# Patient Record
Sex: Female | Born: 1990 | Race: Black or African American | Hispanic: No | Marital: Single | Smoking: Former smoker
Health system: Southern US, Community
[De-identification: ages and names within clinical notes are randomized; demographics above are authoritative.]

## PROBLEM LIST (undated history)

## (undated) ENCOUNTER — Inpatient Hospital Stay (HOSPITAL_COMMUNITY): Payer: Self-pay

## (undated) HISTORY — PX: NO PAST SURGERIES: SHX2092

## (undated) HISTORY — PX: WISDOM TOOTH EXTRACTION: SHX21

---
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
## 2012-04-03 ENCOUNTER — Emergency Department (INDEPENDENT_AMBULATORY_CARE_PROVIDER_SITE_OTHER)
Admission: EM | Admit: 2012-04-03 | Discharge: 2012-04-03 | Disposition: A | Discharge status: Home / Self Care | Payer: Medicaid Other | Source: Home / Self Care | Attending: Emergency Medicine | Admitting: Emergency Medicine

## 2012-04-03 ENCOUNTER — Encounter (HOSPITAL_COMMUNITY): Payer: Self-pay

## 2012-04-03 DIAGNOSIS — N939 Abnormal uterine and vaginal bleeding, unspecified: Secondary | ICD-10-CM

## 2012-04-03 DIAGNOSIS — N926 Irregular menstruation, unspecified: Secondary | ICD-10-CM

## 2012-04-03 LAB — POCT URINALYSIS DIP (DEVICE)
Bilirubin Urine: NEGATIVE
Glucose, UA: NEGATIVE mg/dL
Specific Gravity, Urine: 1.025 (ref 1.005–1.030)
Urobilinogen, UA: 0.2 mg/dL (ref 0.0–1.0)

## 2012-04-03 LAB — HCG, SERUM, QUALITATIVE: Preg, Serum: NEGATIVE

## 2012-04-03 MED ORDER — PRENATAL VITAMINS (DIS) PO TABS: 1.0000 | ORAL_TABLET | Freq: Every day | ORAL | Status: DC

## 2012-04-03 NOTE — ED Provider Notes (Signed)
History     CSN: [id number]  Arrival date & time 04/03/12  1358   None     Chief Complaint  Patient presents with  . Possible Pregnancy    (Consider location/radiation/quality/duration/timing/severity/associated sxs/prior treatment) HPI Comments: Wants pregnancy test, period is late and home pregnancy test is positive.  Thinks might be pregnant as is hungry all the time and urinating frequently.  Denies any pain, denies any vag bleeding.   The history is provided by the patient.    Past Medical History  Diagnosis Date  . Asthma     Past Surgical History  Procedure Date  . No past surgeries     Family History  Problem Relation Age of Onset  . Anesthesia problems Neg Hx     History  Substance Use Topics  . Smoking status: Current Some Day Smoker    Types: Cigarettes  . Smokeless tobacco: Not on file  . Alcohol Use: No    OB History    Grav Para Term Preterm Abortions TAB SAB Ect Mult Living   1               Review of Systems  Constitutional: Negative for fever and chills.  Gastrointestinal: Positive for vomiting. Negative for abdominal pain.       Emesisx1 yesterday when eating chicken, feels fine now.   Genitourinary: Positive for frequency. Negative for dysuria, flank pain, vaginal bleeding, vaginal discharge and vaginal pain.    Allergies  Ciprofloxacin  Home Medications   Current Outpatient Rx  Name Route Sig Dispense Refill  . PRENATAL VITAMINS (DIS) PO TABS Oral Take 1 tablet by mouth daily. 30 tablet 3    BP 121/75  Pulse 90  Temp 98.7 F (37.1 C) (Oral)  Resp 18  SpO2 100%  LMP 02/26/2012  Breastfeeding? Unknown  Physical Exam  Constitutional: She appears well-developed and well-nourished.  Pulmonary/Chest: Effort normal.  Abdominal: Soft. There is no tenderness. There is no CVA tenderness.    ED Course  Procedures (including critical care time)   Labs Reviewed  POCT PREGNANCY, URINE  POCT URINALYSIS DIP (DEVICE)  HCG,  SERUM, QUALITATIVE   No results found.   1. Abnormal menstrual cycle       MDM  Urine preg test here is neg, pt's test at home is pos.  Serum qualitative hcg ordered.  Pt is supposed to call tomorrow for results.  Given rx for prenatal vitamins, and discussed plans for prenatal care if preg or birth control if not preg.         Cathlyn Parsons, NP 04/03/12 [phone]

## 2012-04-03 NOTE — ED Notes (Signed)
Sexually active w/o BC; LMP 02-26-2012; reportedly had a positive home pregnancy test, wants confirmation

## 2012-04-04 ENCOUNTER — Telehealth (HOSPITAL_COMMUNITY): Payer: Self-pay | Admitting: *Deleted

## 2012-04-04 NOTE — ED Provider Notes (Signed)
Medical screening examination/treatment/procedure(s) were performed by non-physician practitioner and as supervising physician I was immediately available for consultation/collaboration.  Melony Tenpas, M.D.   Ambrea Hegler C Malee Grays, MD 04/04/12 0812 

## 2012-04-04 NOTE — ED Notes (Signed)
Pt. called for the result of the serum pregnancy test. Pt. verified x 2 and told it was negative. Vassie Moselle 04/04/2012

## 2012-06-25 ENCOUNTER — Encounter (HOSPITAL_COMMUNITY): Payer: Self-pay | Admitting: Emergency Medicine

## 2012-06-25 ENCOUNTER — Emergency Department (HOSPITAL_COMMUNITY)
Admission: EM | Admit: 2012-06-25 | Discharge: 2012-06-26 | Disposition: A | Discharge status: Home / Self Care | Payer: Self-pay | Attending: Emergency Medicine | Admitting: Emergency Medicine

## 2012-06-25 DIAGNOSIS — F17200 Nicotine dependence, unspecified, uncomplicated: Secondary | ICD-10-CM | POA: Insufficient documentation

## 2012-06-25 DIAGNOSIS — N760 Acute vaginitis: Secondary | ICD-10-CM | POA: Insufficient documentation

## 2012-06-25 DIAGNOSIS — Z3202 Encounter for pregnancy test, result negative: Secondary | ICD-10-CM | POA: Insufficient documentation

## 2012-06-25 DIAGNOSIS — B9689 Other specified bacterial agents as the cause of diseases classified elsewhere: Secondary | ICD-10-CM

## 2012-06-25 DIAGNOSIS — R3915 Urgency of urination: Secondary | ICD-10-CM | POA: Insufficient documentation

## 2012-06-25 DIAGNOSIS — R34 Anuria and oliguria: Secondary | ICD-10-CM | POA: Insufficient documentation

## 2012-06-25 DIAGNOSIS — N390 Urinary tract infection, site not specified: Secondary | ICD-10-CM | POA: Insufficient documentation

## 2012-06-25 DIAGNOSIS — R300 Dysuria: Secondary | ICD-10-CM | POA: Insufficient documentation

## 2012-06-25 DIAGNOSIS — J45909 Unspecified asthma, uncomplicated: Secondary | ICD-10-CM | POA: Insufficient documentation

## 2012-06-25 DIAGNOSIS — R350 Frequency of micturition: Secondary | ICD-10-CM | POA: Insufficient documentation

## 2012-06-25 LAB — URINALYSIS, ROUTINE W REFLEX MICROSCOPIC
Glucose, UA: NEGATIVE mg/dL
Protein, ur: NEGATIVE mg/dL
Urobilinogen, UA: 0.2 mg/dL (ref 0.0–1.0)

## 2012-06-25 LAB — POCT PREGNANCY, URINE: Preg Test, Ur: NEGATIVE

## 2012-06-25 LAB — URINE MICROSCOPIC-ADD ON

## 2012-06-25 NOTE — ED Provider Notes (Signed)
History     CSN: [id number]  Arrival date & time 06/25/12  1816   First MD Initiated Contact with Patient 06/25/12 2257      Chief Complaint  Patient presents with  . Urinary Tract Infection  . Vaginal Discharge    (Consider location/radiation/quality/duration/timing/severity/associated sxs/prior treatment) HPI  22 year old generally healthy female presents complaining of dysuria and vaginal discharge. Patient reports for the past 4 days she has had urinary urgency, and frequency with only small amount of urine each time she urinate. Denies any burning on urination. Since yesterday she noticed some vaginal discharge. She also reports some vaginal irritation but without pain. Onset was gradual, persistent, mild in severity, nonradiating. She is sexually active with only one partner using protection. She denies fever, chills, chest pain, shortness of breath, abdominal pain, back pain, hematuria, vaginal bleeding, or rash. Her last menstrual period was December 28. She reports no prior history of STD.  Past Medical History  Diagnosis Date  . Asthma     Past Surgical History  Procedure Date  . No past surgeries     Family History  Problem Relation Age of Onset  . Anesthesia problems Neg Hx     History  Substance Use Topics  . Smoking status: Current Some Day Smoker    Types: Cigarettes  . Smokeless tobacco: Not on file  . Alcohol Use: No    OB History    Grav Para Term Preterm Abortions TAB SAB Ect Mult Living   1               Review of Systems  Constitutional: Negative for fever.  Gastrointestinal: Negative for nausea and vomiting.  Genitourinary: Positive for dysuria, urgency, frequency, decreased urine volume and vaginal discharge. Negative for hematuria, flank pain, vaginal bleeding, genital sores and vaginal pain.  Skin: Negative for rash.  Neurological: Negative for numbness.    Allergies  Ciprofloxacin  Home Medications  No current outpatient  prescriptions on file.  BP 115/69  Pulse 74  Temp 97.8 F (36.6 C) (Oral)  Resp 18  SpO2 97%  Breastfeeding? Unknown  Physical Exam  Nursing note and vitals reviewed. Constitutional: She appears well-developed and well-nourished. No distress.  HENT:  Head: Normocephalic and atraumatic.  Eyes: Conjunctivae normal are normal.  Neck: Normal range of motion. Neck supple.  Cardiovascular: Normal rate and regular rhythm.   Pulmonary/Chest: Effort normal and breath sounds normal. She exhibits no tenderness.  Abdominal: Soft. There is no tenderness.  Genitourinary: Uterus normal. There is no rash or lesion on the right labia. There is no rash or lesion on the left labia. Cervix exhibits no motion tenderness and no discharge. Right adnexum displays no mass and no tenderness. Left adnexum displays no mass and no tenderness. No erythema, tenderness or bleeding around the vagina. Vaginal discharge (moderate white vaginal discharge) found.       Chaperone present  Lymphadenopathy:       Right: No inguinal adenopathy present.       Left: No inguinal adenopathy present.    ED Course  Procedures (including critical care time)  Labs Reviewed  URINALYSIS, ROUTINE W REFLEX MICROSCOPIC - Abnormal; Notable for the following:    APPearance HAZY (*)     Hgb urine dipstick SMALL (*)     Leukocytes, UA TRACE (*)     All other components within normal limits  URINE MICROSCOPIC-ADD ON - Abnormal; Notable for the following:    Squamous Epithelial / LPF MANY (*)  Bacteria, UA FEW (*)     All other components within normal limits  POCT PREGNANCY, URINE  WET PREP, GENITAL  GC/CHLAMYDIA PROBE AMP   No results found.   No diagnosis found.  Results for orders placed during the hospital encounter of 06/25/12  URINALYSIS, ROUTINE W REFLEX MICROSCOPIC      Component Value Range   Color, Urine YELLOW  YELLOW   APPearance HAZY (*) CLEAR   Specific Gravity, Urine 1.025  1.005 - 1.030   pH 6.5  5.0  - 8.0   Glucose, UA NEGATIVE  NEGATIVE mg/dL   Hgb urine dipstick SMALL (*) NEGATIVE   Bilirubin Urine NEGATIVE  NEGATIVE   Ketones, ur NEGATIVE  NEGATIVE mg/dL   Protein, ur NEGATIVE  NEGATIVE mg/dL   Urobilinogen, UA 0.2  0.0 - 1.0 mg/dL   Nitrite NEGATIVE  NEGATIVE   Leukocytes, UA TRACE (*) NEGATIVE  POCT PREGNANCY, URINE      Component Value Range   Preg Test, Ur NEGATIVE  NEGATIVE  URINE MICROSCOPIC-ADD ON      Component Value Range   Squamous Epithelial / LPF MANY (*) RARE   WBC, UA 0-2  <3 WBC/hpf   RBC / HPF 3-6  <3 RBC/hpf   Bacteria, UA FEW (*) RARE   Urine-Other MUCOUS PRESENT    WET PREP, GENITAL      Component Value Range   Yeast Wet Prep HPF POC FEW (*) NONE SEEN   Trich, Wet Prep NONE SEEN  NONE SEEN   Clue Cells Wet Prep HPF POC MODERATE (*) NONE SEEN   WBC, Wet Prep HPF POC FEW (*) NONE SEEN   No results found.  1. BV  MDM  Pt reports having urinary urgency and frequency along with having white vaginal discharge.  Pelvic exam is remarkable for moderate white vaginal discharge however without adnexal tenderness or CMT.  Abdomen nonsurgical.     1:32 AM Labs shows evidence of BV.  Will treat with flagyl.  Care instruction provided.    BP 115/69  Pulse 74  Temp 97.8 F (36.6 C) (Oral)  Resp 18  SpO2 97%  Breastfeeding? Unknown  I have reviewed nursing notes and vital signs.  I reviewed available ER/hospitalization records thought the EMR      Fayrene Helper, [place] 06/26/12 0133

## 2012-06-25 NOTE — ED Notes (Signed)
Pt c/o white vaginal discharge and urinary frequency x several days; pt sts LMP was 12/28

## 2012-06-26 LAB — WET PREP, GENITAL: Trich, Wet Prep: NONE SEEN

## 2012-06-26 LAB — GC/CHLAMYDIA PROBE AMP
CT Probe RNA: NEGATIVE
GC Probe RNA: NEGATIVE

## 2012-06-26 NOTE — ED Provider Notes (Signed)
Medical screening examination/treatment/procedure(s) were performed by non-physician practitioner and as supervising physician I was immediately available for consultation/collaboration.   Gerhard Munch, MD 06/26/12 [phone]

## 2014-03-08 ENCOUNTER — Emergency Department (HOSPITAL_BASED_OUTPATIENT_CLINIC_OR_DEPARTMENT_OTHER)
Admission: EM | Admit: 2014-03-08 | Discharge: 2014-03-08 | Disposition: A | Discharge status: Home / Self Care | Payer: BC Managed Care – PPO | Attending: Emergency Medicine | Admitting: Emergency Medicine

## 2014-03-08 ENCOUNTER — Encounter (HOSPITAL_BASED_OUTPATIENT_CLINIC_OR_DEPARTMENT_OTHER): Payer: Self-pay | Admitting: Emergency Medicine

## 2014-03-08 DIAGNOSIS — F17200 Nicotine dependence, unspecified, uncomplicated: Secondary | ICD-10-CM | POA: Diagnosis not present

## 2014-03-08 DIAGNOSIS — J45901 Unspecified asthma with (acute) exacerbation: Secondary | ICD-10-CM | POA: Diagnosis not present

## 2014-03-08 DIAGNOSIS — R0602 Shortness of breath: Secondary | ICD-10-CM | POA: Diagnosis present

## 2014-03-08 MED ORDER — IPRATROPIUM-ALBUTEROL 0.5-2.5 (3) MG/3ML IN SOLN
3.0000 mL | Freq: Once | RESPIRATORY_TRACT | Status: AC
  Administered 2014-03-08: 3 mL via RESPIRATORY_TRACT
  Filled 2014-03-08: qty 3

## 2014-03-08 MED ORDER — ALBUTEROL SULFATE HFA 108 (90 BASE) MCG/ACT IN AERS
1.0000 | INHALATION_SPRAY | RESPIRATORY_TRACT | Status: DC | PRN
  Administered 2014-03-08: 2 via RESPIRATORY_TRACT
  Filled 2014-03-08: qty 6.7

## 2014-03-08 NOTE — ED Notes (Signed)
Pt brought in by EMS from work c/o anxiety, with hx of same

## 2014-03-08 NOTE — Discharge Instructions (Signed)

## 2014-03-08 NOTE — ED Provider Notes (Signed)
CSN: [id number]     Arrival date & time 03/08/14  1018 History   First MD Initiated Contact with Patient 03/08/14 1126     Chief Complaint  Patient presents with  . Shortness of Breath    Patient is a 23 y.o. female presenting with shortness of breath. The history is provided by the patient.  Shortness of Breath Severity:  Moderate Onset quality:  Gradual Duration:  1 day Timing:  Constant Progression:  Resolved Chronicity:  Recurrent Relieved by: the breathing treatment in the ED. Associated symptoms: chest pain and wheezing   Associated symptoms: no abdominal pain, no fever and no sore throat   Risk factors: no hx of PE/DVT    the patient has a history of asthma as a child. She no longer has to take medications. Today she started feeling like she was wheezing and having shortness of breath. Coworkers thought she was having an anxiety attack. The patient did not feel that she was having issues with anxiety she said she was just feeling short of breath.  Past Medical History  Diagnosis Date  . Asthma    Past Surgical History  Procedure Laterality Date  . No past surgeries     Family History  Problem Relation Age of Onset  . Anesthesia problems Neg Hx    History  Substance Use Topics  . Smoking status: Current Some Day Smoker -- 0.50 packs/day    Types: Cigarettes  . Smokeless tobacco: Not on file  . Alcohol Use: No   OB History   Grav Para Term Preterm Abortions TAB SAB Ect Mult Living   1              Review of Systems  Constitutional: Negative for fever.  HENT: Negative for sore throat.   Respiratory: Positive for shortness of breath and wheezing.   Cardiovascular: Positive for chest pain.  Gastrointestinal: Negative for abdominal pain.      Allergies  Ciprofloxacin  Home Medications   Prior to Admission medications   Not on File   BP 114/70  Pulse 88  Temp(Src) 98.1 F (36.7 C) (Oral)  Resp 18  Ht  (1.651 m)  Wt 147 lb (66.679 kg)  BMI  24.46 kg/m2  SpO2 100%  LMP 01/28/2014 Physical Exam  Nursing note and vitals reviewed. Constitutional: She appears well-developed and well-nourished. No distress.  HENT:  Head: Normocephalic and atraumatic.  Right Ear: External ear normal.  Left Ear: External ear normal.  Eyes: Conjunctivae are normal. Right eye exhibits no discharge. Left eye exhibits no discharge. No scleral icterus.  Neck: Neck supple. No tracheal deviation present.  Cardiovascular: Normal rate, regular rhythm and intact distal pulses.   Pulmonary/Chest: Effort normal and breath sounds normal. No stridor. No respiratory distress. She has no wheezes. She has no rales.  Normal respiratory rate during my exam  Abdominal: Soft. Bowel sounds are normal. She exhibits no distension. There is no tenderness. There is no rebound and no guarding.  Musculoskeletal: She exhibits no edema and no tenderness.  Neurological: She is alert. She has normal strength. No cranial nerve deficit (no facial droop, extraocular movements intact, no slurred speech) or sensory deficit. She exhibits normal muscle tone. She displays no seizure activity. Coordination normal.  Skin: Skin is warm and dry. No rash noted.  Psychiatric: She has a normal mood and affect.    ED Course  Procedures (including critical care time) Medications  albuterol (PROVENTIL HFA;VENTOLIN HFA) 108 (90 BASE) MCG/ACT inhaler  1-2 puff (not administered)  ipratropium-albuterol (DUONEB) 0.5-2.5 (3) MG/3ML nebulizer solution 3 mL (3 mLs Nebulization Given 03/08/14 1025)     MDM   Final diagnoses:  Asthma exacerbation    The patient was given albuterol treatment while in the emergency department. She had complete relief of her symptoms. Wheezing was noted on her initial exam by the respiratory therapist. Patient is feeling better right now. She denies any chest pain. She is not feeling anxious. I suspect his symptoms were related to bronchospasm. I will discharge her home  with an albuterol inhaler. I discussed the importance of smoking cessation    Linwood Dibbles, MD 03/08/14 1141

## 2014-03-08 NOTE — ED Notes (Signed)
MD at bedside. 

## 2014-03-13 ENCOUNTER — Emergency Department (HOSPITAL_COMMUNITY): Payer: BC Managed Care – PPO

## 2014-03-13 ENCOUNTER — Emergency Department (HOSPITAL_COMMUNITY)
Admission: EM | Admit: 2014-03-13 | Discharge: 2014-03-14 | Disposition: A | Discharge status: Home / Self Care | Payer: BC Managed Care – PPO | Attending: Emergency Medicine | Admitting: Emergency Medicine

## 2014-03-13 ENCOUNTER — Encounter (HOSPITAL_COMMUNITY): Payer: Self-pay | Admitting: Emergency Medicine

## 2014-03-13 DIAGNOSIS — S99929A Unspecified injury of unspecified foot, initial encounter: Principal | ICD-10-CM

## 2014-03-13 DIAGNOSIS — Y9389 Activity, other specified: Secondary | ICD-10-CM | POA: Diagnosis not present

## 2014-03-13 DIAGNOSIS — M25561 Pain in right knee: Secondary | ICD-10-CM

## 2014-03-13 DIAGNOSIS — S99919A Unspecified injury of unspecified ankle, initial encounter: Secondary | ICD-10-CM | POA: Diagnosis present

## 2014-03-13 DIAGNOSIS — J45909 Unspecified asthma, uncomplicated: Secondary | ICD-10-CM | POA: Diagnosis not present

## 2014-03-13 DIAGNOSIS — S8990XA Unspecified injury of unspecified lower leg, initial encounter: Secondary | ICD-10-CM | POA: Insufficient documentation

## 2014-03-13 DIAGNOSIS — F17200 Nicotine dependence, unspecified, uncomplicated: Secondary | ICD-10-CM | POA: Insufficient documentation

## 2014-03-13 DIAGNOSIS — Y92410 Unspecified street and highway as the place of occurrence of the external cause: Secondary | ICD-10-CM | POA: Diagnosis not present
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
## 2014-03-14 MED ORDER — OXYCODONE-ACETAMINOPHEN 5-325 MG PO TABS: 1.0000 | ORAL_TABLET | Freq: Four times a day (QID) | ORAL | Status: DC | PRN

## 2014-03-14 MED ORDER — NAPROXEN 500 MG PO TABS: 500.0000 mg | ORAL_TABLET | Freq: Two times a day (BID) | ORAL | Status: DC

## 2014-03-14 MED ORDER — OXYCODONE-ACETAMINOPHEN 5-325 MG PO TABS
2.0000 | ORAL_TABLET | Freq: Once | ORAL | Status: AC
  Administered 2014-03-14: 2 via ORAL
  Filled 2014-03-14: qty 2
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
## 2014-03-14 NOTE — ED Provider Notes (Signed)
Medical screening examination/treatment/procedure(s) were performed by non-physician practitioner and as supervising physician I was immediately available for consultation/collaboration.   EKG Interpretation None       Nixxon Faria M Cipriano Millikan, MD 03/14/14 0243 

## 2014-03-14 NOTE — Discharge Instructions (Signed)
Your x-ray is negative for fracture or dislocation of your right knee. Recommend you keep your knee in a knee sleeve for stability and comfort. Take naproxen as prescribed for pain. You may take Percocet as needed for severe pain control. Apply ice to your knee 3-4 times per day. Use crutches as needed when walking. Follow up with orthopedics if symptoms persist despite recommended treatment after one to 2 weeks.  Arthralgia Your caregiver has diagnosed you as suffering from an arthralgia. Arthralgia means there is pain in a joint. This can come from many reasons including:  Bruising the joint which causes soreness (inflammation) in the joint.  Wear and tear on the joints which occur as we grow older (osteoarthritis).  Overusing the joint.  Various forms of arthritis.  Infections of the joint. Regardless of the cause of pain in your joint, most of these different pains respond to anti-inflammatory drugs and rest. The exception to this is when a joint is infected, and these cases are treated with antibiotics, if it is a bacterial infection. HOME CARE INSTRUCTIONS   Rest the injured area for as long as directed by your caregiver. Then slowly start using the joint as directed by your caregiver and as the pain allows. Crutches as directed may be useful if the ankles, knees or hips are involved. If the knee was splinted or casted, continue use and care as directed. If an stretchy or elastic wrapping bandage has been applied today, it should be removed and re-applied every 3 to 4 hours. It should not be applied tightly, but firmly enough to keep swelling down. Watch toes and feet for swelling, bluish discoloration, coldness, numbness or excessive pain. If any of these problems (symptoms) occur, remove the ace bandage and re-apply more loosely. If these symptoms persist, contact your caregiver or return to this location.  For the first 24 hours, keep the injured extremity elevated on pillows while lying  down.  Apply ice for 15-20 minutes to the sore joint every couple hours while awake for the first half day. Then 03-04 times per day for the first 48 hours. Put the ice in a plastic bag and place a towel between the bag of ice and your skin.  Wear any splinting, casting, elastic bandage applications, or slings as instructed.  Only take over-the-counter or prescription medicines for pain, discomfort, or fever as directed by your caregiver. Do not use aspirin immediately after the injury unless instructed by your physician. Aspirin can cause increased bleeding and bruising of the tissues.  If you were given crutches, continue to use them as instructed and do not resume weight bearing on the sore joint until instructed. Persistent pain and inability to use the sore joint as directed for more than 2 to 3 days are warning signs indicating that you should see a caregiver for a follow-up visit as soon as possible. Initially, a hairline fracture (break in bone) may not be evident on X-rays. Persistent pain and swelling indicate that further evaluation, non-weight bearing or use of the joint (use of crutches or slings as instructed), or further X-rays are indicated. X-rays may sometimes not show a small fracture until a week or 10 days later. Make a follow-up appointment with your own caregiver or one to whom we have referred you. A radiologist (specialist in reading X-rays) may read your X-rays. Make sure you know how you are to obtain your X-ray results. Do not assume everything is normal if you do not hear from [place]. SEEK  MEDICAL CARE IF: Bruising, swelling, or pain increases. SEEK IMMEDIATE MEDICAL CARE IF:   Your fingers or toes are numb or blue.  The pain is not responding to medications and continues to stay the same or get worse.  The pain in your joint becomes severe.  You develop a fever over 102 F (38.9 C).  It becomes impossible to move or use the joint. MAKE SURE YOU:   Understand these  instructions.  Will watch your condition.  Will get help right away if you are not doing well or get worse. Document Released: 06/05/2005 Document Revised: 08/28/2011 Document Reviewed: 01/22/2008 [facility] Patient Information 2015 [place], [place]. This information is not intended to replace advice given to you by your health care provider. Make sure you discuss any questions you have with your health care provider. RICE: Routine Care for Injuries The routine care of many injuries includes Rest, Ice, Compression, and Elevation (RICE). HOME CARE INSTRUCTIONS  Rest is needed to allow your body to heal. Routine activities can usually be resumed when comfortable. Injured tendons and bones can take up to 6 weeks to heal. Tendons are the cord-like structures that attach muscle to bone.  Ice following an injury helps keep the swelling down and reduces pain.  Put ice in a plastic bag.  Place a towel between your skin and the bag.  Leave the ice on for 15-20 minutes, 3-4 times a day, or as directed by your health care provider. Do this while awake, for the first 24 to 48 hours. After that, continue as directed by your caregiver.  Compression helps keep swelling down. It also gives support and helps with discomfort. If an elastic bandage has been applied, it should be removed and reapplied every 3 to 4 hours. It should not be applied tightly, but firmly enough to keep swelling down. Watch fingers or toes for swelling, bluish discoloration, coldness, numbness, or excessive pain. If any of these problems occur, remove the bandage and reapply loosely. Contact your caregiver if these problems continue.  Elevation helps reduce swelling and decreases pain. With extremities, such as the arms, hands, legs, and feet, the injured area should be placed near or above the level of the heart, if possible. SEEK IMMEDIATE MEDICAL CARE IF:  You have persistent pain and swelling.  You develop redness, numbness, or  unexpected weakness.  Your symptoms are getting worse rather than improving after several days. These symptoms may indicate that further evaluation or further X-rays are needed. Sometimes, X-rays may not show a small broken bone (fracture) until 1 week or 10 days later. Make a follow-up appointment with your caregiver. Ask when your X-ray results will be ready. Make sure you get your X-ray results. Document Released: 09/17/2000 Document Revised: 06/10/2013 Document Reviewed: 11/04/2010 [facility] Patient Information 2015 [place], [place]. This information is not intended to replace advice given to you by your health care provider. Make sure you discuss any questions you have with your health care provider.

## 2014-04-20 ENCOUNTER — Encounter (HOSPITAL_COMMUNITY): Payer: Self-pay | Admitting: Emergency Medicine

## 2014-04-21 ENCOUNTER — Emergency Department (HOSPITAL_COMMUNITY)
Admission: EM | Admit: 2014-04-21 | Discharge: 2014-04-21 | Disposition: A | Discharge status: Home / Self Care | Payer: Medicaid Other | Attending: Emergency Medicine | Admitting: Emergency Medicine

## 2014-04-21 DIAGNOSIS — Z720 Tobacco use: Secondary | ICD-10-CM | POA: Insufficient documentation

## 2014-04-21 DIAGNOSIS — J45909 Unspecified asthma, uncomplicated: Secondary | ICD-10-CM | POA: Diagnosis not present

## 2014-04-21 DIAGNOSIS — Z791 Long term (current) use of non-steroidal anti-inflammatories (NSAID): Secondary | ICD-10-CM | POA: Insufficient documentation

## 2014-04-21 DIAGNOSIS — N760 Acute vaginitis: Secondary | ICD-10-CM | POA: Diagnosis not present

## 2014-04-21 DIAGNOSIS — Z3201 Encounter for pregnancy test, result positive: Secondary | ICD-10-CM | POA: Diagnosis not present

## 2014-04-21 DIAGNOSIS — N898 Other specified noninflammatory disorders of vagina: Secondary | ICD-10-CM | POA: Diagnosis present

## 2014-04-21 DIAGNOSIS — B9689 Other specified bacterial agents as the cause of diseases classified elsewhere: Secondary | ICD-10-CM

## 2014-04-21 LAB — WET PREP, GENITAL
TRICH WET PREP: NONE SEEN
YEAST WET PREP: NONE SEEN

## 2014-04-21 LAB — URINALYSIS, ROUTINE W REFLEX MICROSCOPIC
Bilirubin Urine: NEGATIVE
GLUCOSE, UA: NEGATIVE mg/dL
HGB URINE DIPSTICK: NEGATIVE
Ketones, ur: NEGATIVE mg/dL
LEUKOCYTES UA: NEGATIVE
Nitrite: NEGATIVE
PH: 8 (ref 5.0–8.0)
Protein, ur: NEGATIVE mg/dL
SPECIFIC GRAVITY, URINE: 1.027 (ref 1.005–1.030)
Urobilinogen, UA: 0.2 mg/dL (ref 0.0–1.0)

## 2014-04-21 LAB — I-STAT BETA HCG BLOOD, ED (MC, WL, AP ONLY): I-stat hCG, quantitative: 19 m[IU]/mL — ABNORMAL HIGH (ref <5)

## 2014-04-21 LAB — POC URINE PREG, ED: Preg Test, Ur: NEGATIVE

## 2014-04-21 LAB — RPR

## 2014-04-21 LAB — HIV ANTIBODY (ROUTINE TESTING W REFLEX): HIV: NONREACTIVE

## 2014-04-21 MED ORDER — PRENATAL COMPLETE 14-0.4 MG PO TABS: 1.0000 | ORAL_TABLET | Freq: Two times a day (BID) | ORAL | Status: DC

## 2014-04-21 MED ORDER — METRONIDAZOLE 500 MG PO TABS: 500.0000 mg | ORAL_TABLET | Freq: Two times a day (BID) | ORAL | Status: DC

## 2014-04-21 NOTE — ED Notes (Signed)
Vaginal irritation; took monostat last night but not helping; thin, Soila Printup d/c.

## 2014-04-21 NOTE — ED Provider Notes (Signed)
CSN: [id number]     Arrival date & time 04/21/14  [id number] History   First MD Initiated Contact with Patient 04/21/14 [id number]     Chief Complaint  Patient presents with  . Vaginal Discharge     (Consider location/radiation/quality/duration/timing/severity/associated sxs/prior Treatment) HPI   Patient to the ED with complaints of dysuria, vaginal irritation and discharge. She says that the symptoms have been present less than a week. She took AZO and Monistat OTC, none of these have alleviated her symptoms. She has not had any belly pain. No n/v/d/f, weakness, lethargy. Otherwise she feels well. VSS.  LMP October 12. Has had unprotected sex since then and is not on birth control.  Past Medical History  Diagnosis Date  . Asthma    Past Surgical History  Procedure Laterality Date  . No past surgeries     Family History  Problem Relation Age of Onset  . Anesthesia problems Neg Hx    History  Substance Use Topics  . Smoking status: Current Some Day Smoker -- 0.50 packs/day    Types: Cigarettes  . Smokeless tobacco: Not on file  . Alcohol Use: No   OB History    Gravida Para Term Preterm AB TAB SAB Ectopic Multiple Living   1              Review of Systems 10 Systems reviewed and are negative for acute change except as noted in the HPI.    Allergies  Ciprofloxacin  Home Medications   Prior to Admission medications   Medication Sig Start Date End Date Taking? Authorizing Provider  metroNIDAZOLE (FLAGYL) 500 MG tablet Take 1 tablet (500 mg total) by mouth 2 (two) times daily. 04/21/14   Saarah Dewing Irine SealG Artesia Berkey, PA-C  naproxen (NAPROSYN) 500 MG tablet Take 1 tablet (500 mg total) by mouth 2 (two) times daily. 03/14/14   Antony MaduraKelly Humes, PA-C  oxyCODONE-acetaminophen (PERCOCET/ROXICET) 5-325 MG per tablet Take 1-2 tablets by mouth every 6 (six) hours as needed for moderate pain or severe pain. 03/14/14   Antony MaduraKelly Humes, PA-C  Prenatal Vit-Fe Fumarate-FA (PRENATAL COMPLETE) 14-0.4 MG TABS Take 1  tablet by mouth 2 (two) times daily. 04/21/14   Teagan Ozawa Irine SealG Pepper Kerrick, PA-C   BP 104/57 mmHg  Pulse 95  Temp(Src) 98.8 F (37.1 C) (Oral)  Resp 12  Ht 5\' 5"  (1.651 m)  Wt 140 lb (63.504 kg)  BMI 23.30 kg/m2  SpO2 97%  LMP 03/30/2014 Physical Exam  Constitutional: She appears well-developed and well-nourished. No distress.  HENT:  Head: Normocephalic and atraumatic.  Eyes: Pupils are equal, round, and reactive to light.  Neck: Normal range of motion. Neck supple.  Cardiovascular: Normal rate and regular rhythm.   Pulmonary/Chest: Effort normal.  Abdominal: Soft.  Genitourinary: Uterus normal. Cervix exhibits no motion tenderness, no discharge and no friability. Right adnexum displays no mass and no tenderness. Left adnexum displays no mass and no tenderness. There is tenderness in the vagina. No bleeding in the vagina. No foreign body around the vagina. No vaginal discharge found.  Neurological: She is alert.  Skin: Skin is warm and dry.  Nursing note and vitals reviewed.   ED Course  Procedures (including critical care time) Labs Review Labs Reviewed  WET PREP, GENITAL - Abnormal; Notable for the following:    Clue Cells Wet Prep HPF POC FEW (*)    WBC, Wet Prep HPF POC FEW (*)    All other components within normal limits  I-STAT BETA HCG BLOOD, ED ([facility],  [facility], [facility] ONLY) - Abnormal; Notable for the following:    I-stat hCG, quantitative 19.0 (*)    All other components within normal limits  GC/CHLAMYDIA PROBE AMP  URINALYSIS, ROUTINE W REFLEX MICROSCOPIC  RPR  HIV ANTIBODY (ROUTINE TESTING)  POC URINE PREG, ED    Imaging Review No results found.   EKG Interpretation None      MDM   Final diagnoses:  Positive pregnancy test  BV (bacterial vaginosis)    Pt has a positive hcg poc test that is very early, suspect she is very early in pregnancy.  Wet Prep shows few "clue cells". Will treat with Flagyl, a few WBC's will wait for culture results before treating per pt  request. Normal urine.   RPR and HIV pending.  [facility] on prenatals [facility]  22 y.o.Anne Nguyen's evaluation in the Emergency Department is complete. It has been determined that no acute conditions requiring further emergency intervention are present at this time. The patient/guardian have been advised of the diagnosis and plan. We have discussed signs and symptoms that warrant return to the ED, such as changes or worsening in symptoms.  Vital signs are stable at discharge. Filed Vitals:   04/21/14 1015  BP:   Pulse: 95  Temp:   Resp:     Patient/guardian has voiced understanding and agreed to follow-up with the PCP or specialist.     Dorthula Matasiffany G Lemond Griffee, PA-C 04/21/14 1214

## 2014-04-21 NOTE — Discharge Instructions (Signed)
Bacterial Vaginosis Bacterial vaginosis is a vaginal infection that occurs when the normal balance of bacteria in the vagina is disrupted. It results from an overgrowth of certain bacteria. This is the most common vaginal infection in women of childbearing age. Treatment is important to prevent complications, especially in pregnant women, as it can cause a premature delivery. CAUSES  Bacterial vaginosis is caused by an increase in harmful bacteria that are normally present in smaller amounts in the vagina. Several different kinds of bacteria can cause bacterial vaginosis. However, the reason that the condition develops is not fully understood. RISK FACTORS Certain activities or behaviors can put you at an increased risk of developing bacterial vaginosis, including:  Having a new sex partner or multiple sex partners.  Douching.  Using an intrauterine device (IUD) for contraception. Women do not get bacterial vaginosis from toilet seats, bedding, swimming pools, or contact with objects around them. SIGNS AND SYMPTOMS  Some women with bacterial vaginosis have no signs or symptoms. Common symptoms include:  Grey vaginal discharge.  A fishlike odor with discharge, especially after sexual intercourse.  Itching or burning of the vagina and vulva.  Burning or pain with urination. DIAGNOSIS  Your health care provider will take a medical history and examine the vagina for signs of bacterial vaginosis. A sample of vaginal fluid may be taken. Your health care provider will look at this sample under a microscope to check for bacteria and abnormal cells. A vaginal pH test may also be done.  TREATMENT  Bacterial vaginosis may be treated with antibiotic medicines. These may be given in the form of a pill or a vaginal cream. A second round of antibiotics may be prescribed if the condition comes back after treatment.  HOME CARE INSTRUCTIONS   Only take over-the-counter or prescription medicines as  directed by your health care provider.  If antibiotic medicine was prescribed, take it as directed. Make sure you finish it even if you start to feel better.  Do not have sex until treatment is completed.  Tell all sexual partners that you have a vaginal infection. They should see their health care provider and be treated if they have problems, such as a mild rash or itching.  Practice safe sex by using condoms and only having one sex partner. SEEK MEDICAL CARE IF:   Your symptoms are not improving after 3 days of treatment.  You have increased discharge or pain.  You have a fever. MAKE SURE YOU:   Understand these instructions.  Will watch your condition.  Will get help right away if you are not doing well or get worse. FOR MORE INFORMATION  Prenatal Care  WHAT IS PRENATAL CARE?  Prenatal care means health care during your pregnancy, before your baby is born. It is very important to take care of yourself and your baby during your pregnancy by:   Getting early prenatal care. If you know you are pregnant, or think you might be pregnant, call your health care provider as soon as possible. Schedule a visit for a prenatal exam.  Getting regular prenatal care. Follow your health care provider's schedule for blood and other necessary tests. Do not miss appointments.  Doing everything you can to keep yourself and your baby healthy during your pregnancy.  Getting complete care. Prenatal care should include evaluation of the medical, dietary, educational, psychological, and social needs of you and your significant other. The medical and genetic history of your family and the family of your baby's father should  be discussed with your health care provider.  Discussing with your health care provider:  Prescription, over-the-counter, and herbal medicines that you take.  Any history of substance abuse, alcohol use, smoking, and illegal drug use.  Any history of domestic abuse and  violence.  Immunizations you have received.  Your nutrition and diet.  The amount of exercise you do.  Any environmental and occupational hazards to which you are exposed.  History of sexually transmitted infections for both you and your partner.  Previous pregnancies you have had. WHY IS PRENATAL CARE SO IMPORTANT?  By regularly seeing your health care provider, you help ensure that problems can be identified early so that they can be treated as soon as possible. Other problems might be prevented. Many studies have shown that early and regular prenatal care is important for the health of mothers and their babies.  HOW CAN I TAKE CARE OF MYSELF WHILE I AM PREGNANT?  Here are ways to take care of yourself and your baby:   Start or continue taking your multivitamin with 400 micrograms (mcg) of folic acid every day.  Get early and regular prenatal care. It is very important to see a health care provider during your pregnancy. Your health care provider will check at each visit to make sure that you and your baby are healthy. If there are any problems, action can be taken right away to help you and your baby.  Eat a healthy diet that includes:  Fruits.  Vegetables.  Foods low in saturated fat.  Whole grains.  Calcium-rich foods, such as milk, yogurt, and hard cheeses.  Drink 6-8 glasses of liquids a day.  Unless your health care provider tells you not to, try to be physically active for 30 minutes, most days of the week. If you are pressed for time, you can get your activity in through 10-minute segments, three times a day.  Do not smoke, drink alcohol, or use drugs. These can cause long-term damage to your baby. Talk with your health care provider about steps to take to stop smoking. Talk with a member of your faith community, a counselor, a trusted friend, or your health care provider if you are concerned about your alcohol or drug use.  Ask your health care provider before  taking any medicine, even over-the-counter medicines. Some medicines are not safe to take during pregnancy.  Get plenty of rest and sleep.  Avoid hot tubs and saunas during pregnancy.  Do not have X-rays taken unless absolutely necessary and with the recommendation of your health care provider. A lead shield can be placed on your abdomen to protect your baby when X-rays are taken in other parts of your body.  Do not empty the cat litter when you are pregnant. It may contain a parasite that causes an infection called toxoplasmosis, which can cause birth defects. Also, use gloves when working in garden areas used by cats.  Do not eat uncooked or undercooked meats or fish.  Do not eat soft, mold-ripened cheeses (Brie, Camembert, and chevre) or soft, blue-veined cheese (Danish blue and Roquefort).  Stay away from toxic chemicals like:  Insecticides.  Solvents (some cleaners or paint thinners).  Lead.  Mercury.  Sexual intercourse may continue until the end of the pregnancy, unless you have a medical problem or there is a problem with the pregnancy and your health care provider tells you not to.  Do not wear high-heel shoes, especially during the second half of the pregnancy. You can  lose your balance and fall.  Do not take long trips, unless absolutely necessary. Be sure to see your health care provider before going on the trip.  Do not sit in one position for more than 2 hours when on a trip.  Take a copy of your medical records when going on a trip. Know where a hospital is located in the city you are visiting, in case of an emergency.  Most dangerous household products will have pregnancy warnings on their labels. Ask your health care provider about products if you are unsure.  Limit or eliminate your caffeine intake from coffee, tea, sodas, medicines, and chocolate.  Many women continue working through pregnancy. Staying active might help you stay healthier. If you have a  question about the safety or the hours you work at your particular job, talk with your health care provider.  Get informed:  Read books.  Watch videos.  Go to childbirth classes for you and your significant other.  Talk with experienced moms.  Ask your health care provider about childbirth education classes for you and your partner. Classes can help you and your partner prepare for the birth of your baby.  Ask about a baby doctor (pediatrician) and methods and pain medicine for labor, delivery, and possible cesarean delivery. HOW OFTEN SHOULD I SEE MY HEALTH CARE PROVIDER DURING PREGNANCY?  Your health care provider will give you a schedule for your prenatal visits. You will have visits more often as you get closer to the end of your pregnancy. An average pregnancy lasts about 40 weeks.  A typical schedule includes visiting your health care provider:   About once each month during your first 6 months of pregnancy.  Every 2 weeks during the next 2 months.  Weekly in the last month, until the delivery date. Your health care provider will probably want to see you more often if:  You are older than 35 years.  Your pregnancy is high risk because you have certain health problems or problems with the pregnancy, such as:  Diabetes.  High blood pressure.  The baby is not growing on schedule, according to the dates of the pregnancy. Your health care provider will do special tests to make sure you and your baby are not having any serious problems. WHAT HAPPENS DURING PRENATAL VISITS?   At your first prenatal visit, your health care provider will do a physical exam and talk to you about your health history and the health history of your partner and your family. Your health care provider will be able to tell you what date to expect your baby to be born on.  Your first physical exam will include checks of your blood pressure, measurements of your height and weight, and an exam of your  pelvic organs. Your health care provider will do a Pap test if you have not had one recently and will do cultures of your cervix to make sure there is no infection.  At each prenatal visit, there will be tests of your blood, urine, blood pressure, weight, and the progress of the baby will be checked.  At your later prenatal visits, your health care provider will check how you are doing and how your baby is developing. You may have a number of tests done as your pregnancy progresses.  Ultrasound exams are often used to check on your baby's growth and health.  You may have more urine and blood tests, as well as special tests, if needed. These may include amniocentesis  to examine fluid in the pregnancy sac, stress tests to check how the baby responds to contractions, or a biophysical profile to measure your baby's well-being. Your health care provider will explain the tests and why they are necessary.  You should be tested for high blood sugar (gestational diabetes) between the 24th and 28th weeks of your pregnancy.  You should discuss with your health care provider your plans to breastfeed or bottle-feed your baby.  Each visit is also a chance for you to learn about staying healthy during pregnancy and to ask questions. Document Released: 06/08/2003 Document Revised: 06/10/2013 Document Reviewed: 08/20/2013 [facility] Patient Information 2015 [place], [place]. This information is not intended to replace advice given to you by your health care provider. Make sure you discuss any questions you have with your health care provider.  Centers for Disease Control and Prevention, Division of STD Prevention: [employer]Apps.[employer]za American Sexual Health Association (ASHA): www.ashastd.org  Document Released: 06/05/2005 Document Revised: 03/26/2013 Document Reviewed: 01/15/2013 [facility] Patient Information 2015 [place], [place]. This information is not intended to replace advice given to you by your health care  provider. Make sure you discuss any questions you have with your health care provider.

## 2014-04-21 NOTE — ED Notes (Signed)
Chaperoned Tiffany, PA with pelvic examination; vaginal samples were collected by Tiffany, PA and sent to lab by me; pt talking on her cell phone at this time

## 2014-04-22 LAB — GC/CHLAMYDIA PROBE AMP
CT PROBE, AMP APTIMA: NEGATIVE
GC Probe RNA: NEGATIVE

## 2014-05-01 ENCOUNTER — Inpatient Hospital Stay (HOSPITAL_COMMUNITY)
Admission: AD | Admit: 2014-05-01 | Discharge: 2014-05-01 | Disposition: A | Discharge status: Home / Self Care | Payer: Medicaid Other | Source: Ambulatory Visit | Attending: Obstetrics and Gynecology | Admitting: Obstetrics and Gynecology

## 2014-05-01 ENCOUNTER — Encounter (HOSPITAL_COMMUNITY): Payer: Self-pay | Admitting: *Deleted

## 2014-05-01 DIAGNOSIS — Z3201 Encounter for pregnancy test, result positive: Secondary | ICD-10-CM | POA: Diagnosis not present

## 2014-05-01 LAB — POCT PREGNANCY, URINE: PREG TEST UR: POSITIVE — AB

## 2014-05-01 NOTE — MAU Provider Note (Signed)
This patient presents with request of pregnancy verification. She denies pain or bleeding. Her pregnancy test here today is positive and I have given her the verification letter. She is to begin prenatal care asap and [facility].  She is to start prenatal vitamins. She will return here as needed for any problems.  Pt aware not to take Naproxen/Ibuprofen or other unapproved medications.  No further questions or concerns from patient.

## 2014-05-01 NOTE — MAU Note (Signed)
[facility] . [facility] came back at 19. Told to f/u. Pt here to f/u . Denies pain or bleeidng just reports sore breasts

## 2014-05-01 NOTE — Discharge Instructions (Signed)
Prenatal Care Providers °[facility]    [facility] ° Phone- [phone]     Phone: [phone] °         °[facility]                      [facility] ° @[place]     Phone: [phone] ° Phone: [phone] °        [facility] °[facility]     Phone: [phone] ° Phone: [phone]   °        [facility] °[facility] [place]                hone: [phone] ° Phone: [phone] °        [facility] °Dr. Bernard Marshall      Phone: [phone] ° Phone: [phone] °        [facility] °[facility]                Phone: [phone] ° Women’s Health  ° Phone:[phone]    [facility] ([place]) °         Phone: [phone] °[facility] °  Phone: [phone] °

## 2014-05-18 ENCOUNTER — Encounter (HOSPITAL_COMMUNITY): Payer: Self-pay | Admitting: *Deleted

## 2014-05-18 ENCOUNTER — Inpatient Hospital Stay (HOSPITAL_COMMUNITY): Payer: Medicaid Other

## 2014-05-18 ENCOUNTER — Inpatient Hospital Stay (HOSPITAL_COMMUNITY)
Admission: AD | Admit: 2014-05-18 | Discharge: 2014-05-18 | Disposition: A | Discharge status: Home / Self Care | Payer: Medicaid Other | Source: Ambulatory Visit | Attending: Obstetrics & Gynecology | Admitting: Obstetrics & Gynecology

## 2014-05-18 DIAGNOSIS — R109 Unspecified abdominal pain: Secondary | ICD-10-CM | POA: Insufficient documentation

## 2014-05-18 DIAGNOSIS — Z87891 Personal history of nicotine dependence: Secondary | ICD-10-CM | POA: Insufficient documentation

## 2014-05-18 DIAGNOSIS — O9989 Other specified diseases and conditions complicating pregnancy, childbirth and the puerperium: Secondary | ICD-10-CM | POA: Diagnosis not present

## 2014-05-18 DIAGNOSIS — O26899 Other specified pregnancy related conditions, unspecified trimester: Secondary | ICD-10-CM

## 2014-05-18 DIAGNOSIS — Z3A01 Less than 8 weeks gestation of pregnancy: Secondary | ICD-10-CM | POA: Insufficient documentation

## 2014-05-18 LAB — CBC
HCT: 37.8 % (ref 36.0–46.0)
Hemoglobin: 12.9 g/dL (ref 12.0–15.0)
MCH: 26.5 pg (ref 26.0–34.0)
MCHC: 34.1 g/dL (ref 30.0–36.0)
MCV: 77.6 fL — ABNORMAL LOW (ref 78.0–100.0)
PLATELETS: 274 10*3/uL (ref 150–400)
RBC: 4.87 MIL/uL (ref 3.87–5.11)
RDW: 14.6 % (ref 11.5–15.5)
WBC: 7.1 10*3/uL (ref 4.0–10.5)

## 2014-05-18 LAB — URINALYSIS, ROUTINE W REFLEX MICROSCOPIC
BILIRUBIN URINE: NEGATIVE
Glucose, UA: NEGATIVE mg/dL
HGB URINE DIPSTICK: NEGATIVE
Ketones, ur: NEGATIVE mg/dL
Leukocytes, UA: NEGATIVE
Nitrite: NEGATIVE
Protein, ur: NEGATIVE mg/dL
Specific Gravity, Urine: 1.025 (ref 1.005–1.030)
Urobilinogen, UA: 0.2 mg/dL (ref 0.0–1.0)
pH: 7 (ref 5.0–8.0)

## 2014-05-18 LAB — WET PREP, GENITAL
Clue Cells Wet Prep HPF POC: NONE SEEN
TRICH WET PREP: NONE SEEN
Yeast Wet Prep HPF POC: NONE SEEN

## 2014-05-18 LAB — HCG, QUANTITATIVE, PREGNANCY: HCG, BETA CHAIN, QUANT, S: 69703 m[IU]/mL — AB (ref <5)

## 2014-05-18 LAB — HIV ANTIBODY (ROUTINE TESTING W REFLEX): HIV: NONREACTIVE

## 2014-05-18 NOTE — Discharge Instructions (Signed)
First Trimester of Pregnancy The first trimester of pregnancy is from week 1 until the end of week 12 (months 1 through 3). During this time, your baby will begin to develop inside you. At 6-8 weeks, the eyes and face are formed, and the heartbeat can be seen on ultrasound. At the end of 12 weeks, all the baby's organs are formed. Prenatal care is all the medical care you receive before the birth of your baby. Make sure you get good prenatal care and follow all of your doctor's instructions. HOME CARE  Medicines  Take medicine only as told by your doctor. Some medicines are safe and some are not during pregnancy.  Take your prenatal vitamins as told by your doctor.  Take medicine that helps you poop (stool softener) as needed if your doctor says it is okay. Diet  Eat regular, healthy meals.  Your doctor will tell you the amount of weight gain that is right for you.  Avoid raw meat and uncooked cheese.  If you feel sick to your stomach (nauseous) or throw up (vomit):  Eat 4 or 5 small meals a day instead of 3 large meals.  Try eating a few soda crackers.  Drink liquids between meals instead of during meals.  If you have a hard time pooping (constipation):  Eat high-fiber foods like fresh vegetables, fruit, and whole grains.  Drink enough fluids to keep your pee (urine) clear or pale yellow. Activity and Exercise  Exercise only as told by your doctor. Stop exercising if you have cramps or pain in your lower belly (abdomen) or low back.  Try to avoid standing for long periods of time. Move your legs often if you must stand in one place for a long time.  Avoid heavy lifting.  Wear low-heeled shoes. Sit and stand up straight.  You can have sex unless your doctor tells you not to. Relief of Pain or Discomfort  Wear a good support bra if your breasts are sore.  Take warm water baths (sitz baths) to soothe pain or discomfort caused by hemorrhoids. Use hemorrhoid cream if your  doctor says it is okay.  Rest with your legs raised if you have leg cramps or low back pain.  Wear support hose if you have puffy, bulging veins (varicose veins) in your legs. Raise (elevate) your feet for 15 minutes, 3-4 times a day. Limit salt in your diet. Prenatal Care  Schedule your prenatal visits by the twelfth week of pregnancy.  Write down your questions. Take them to your prenatal visits.  Keep all your prenatal visits as told by your doctor. Safety  Wear your seat belt at all times when driving.  Make a list of emergency phone numbers. The list should include numbers for family, friends, the hospital, and police and fire departments. General Tips  Ask your doctor for a referral to a local prenatal class. Begin classes no later than at the start of month 6 of your pregnancy.  Ask for help if you need counseling or help with nutrition. Your doctor can give you advice or tell you where to go for help.  Do not use hot tubs, steam rooms, or saunas.  Do not douche or use tampons or scented sanitary pads.  Do not cross your legs for long periods of time.  Avoid litter boxes and soil used by cats.  Avoid all smoking, herbs, and alcohol. Avoid drugs not approved by your doctor.  Visit your dentist. At home, brush your teeth   with a soft toothbrush. Be gentle when you floss. GET HELP IF:  You are dizzy.  You have mild cramps or pressure in your lower belly.  You have a nagging pain in your belly area.  You continue to feel sick to your stomach, throw up, or have watery poop (diarrhea).  You have a bad smelling fluid coming from your vagina.  You have pain with peeing (urination).  You have increased puffiness (swelling) in your face, hands, legs, or ankles. GET HELP RIGHT AWAY IF:   You have a fever.  You are leaking fluid from your vagina.  You have spotting or bleeding from your vagina.  You have very bad belly cramping or pain.  You gain or lose weight  rapidly.  You throw up blood. It may look like coffee grounds.  You are around people who have German measles, fifth disease, or chickenpox.  You have a very bad headache.  You have shortness of breath.  You have any kind of trauma, such as from a fall or a car accident. Document Released: 11/22/2007 Document Revised: 10/20/2013 Document Reviewed: 04/15/2013 [employer] Patient Information 2015 [employer]. This information is not intended to replace advice given to you by your health care provider. Make sure you discuss any questions you have with your health care provider.  

## 2014-05-18 NOTE — MAU Note (Signed)
Patient states she has been having low back and low abdominal cramping for a couple of of days. Denies bleeding, discharge, nausea, vomiting or diarrhea.

## 2014-05-18 NOTE — MAU Provider Note (Signed)
History     CSN: [id number]  Arrival date and time: 05/18/14 1522   First Provider Initiated Contact with Patient 05/18/14 1610      Chief Complaint  Patient presents with  . Back Pain  . Abdominal Cramping   HPI  [name] is a 23 y.o. G2P0010 at 1873w0d who presents to MAU today with complaint of off and on lower abdominal cramping x 2-3 days. The patient denies vaginal bleeding, discharge, UTI symptoms or fever. She states mild nausea without vomiting, diarrhea or constipation. She denies recent intercourse. She plans to go to [facility] for prenatal care.   OB History    Gravida Para Term Preterm AB TAB SAB Ectopic Multiple Living   2    1  1          Past Medical History  Diagnosis Date  . Asthma     Past Surgical History  Procedure Laterality Date  . No past surgeries      Family History  Problem Relation Age of Onset  . Anesthesia problems Neg Hx     History  Substance Use Topics  . Smoking status: Former Smoker -- 0.50 packs/day    Types: Cigarettes    Quit date: 04/19/2014  . Smokeless tobacco: Not on file  . Alcohol Use: No    Allergies:  Allergies  Allergen Reactions  . Ciprofloxacin Anaphylaxis and Other (See Comments)    Gets really hot.    Prescriptions prior to admission  Medication Sig Dispense Refill Last Dose  . albuterol (PROVENTIL HFA;VENTOLIN HFA) 108 (90 BASE) MCG/ACT inhaler Inhale 2 puffs into the lungs every 6 (six) hours as needed for wheezing or shortness of breath.   Past Month at Unknown time  . Prenatal Vit-Fe Fumarate-FA (PRENATAL COMPLETE) 14-0.4 MG TABS Take 1 tablet by mouth 2 (two) times daily. 60 each 2 05/18/2014 at Unknown time  . metroNIDAZOLE (FLAGYL) 500 MG tablet Take 1 tablet (500 mg total) by mouth 2 (two) times daily. (Patient not taking: Reported on 05/18/2014) 14 tablet 0 More than a month at Unknown time  . naproxen (NAPROSYN) 500 MG tablet Take 1 tablet (500 mg total) by mouth 2 (two) times daily. (Patient not  taking: Reported on 05/18/2014) 30 tablet 0 More than a month at Unknown time  . oxyCODONE-acetaminophen (PERCOCET/ROXICET) 5-325 MG per tablet Take 1-2 tablets by mouth every 6 (six) hours as needed for moderate pain or severe pain. (Patient not taking: Reported on 05/18/2014) 15 tablet 0 More than a month at Unknown time    Review of Systems  Constitutional: Negative for fever and malaise/fatigue.  Gastrointestinal: Positive for nausea and abdominal pain. Negative for vomiting, diarrhea and constipation.  Genitourinary: Negative for dysuria, urgency and frequency.       Neg - vaginal bleeding, discharge   Physical Exam   Blood pressure 119/67, pulse 110, temperature 98.1 F (36.7 C), temperature source Oral, resp. rate 16, height 5\' 6"  (1.676 m), weight 143 lb 9.6 oz (65.137 kg), last menstrual period 03/30/2014, SpO2 100 %, unknown if currently breastfeeding.  Physical Exam  Constitutional: She is oriented to person, place, and time. She appears well-developed and well-nourished. No distress.  HENT:  Head: Normocephalic.  Cardiovascular: Normal rate.   Respiratory: Effort normal.  GI: Soft. She exhibits no distension and no mass. There is no tenderness. There is no rebound and no guarding.  Genitourinary: There is no rash, tenderness or lesion on the right labia. There is no rash, tenderness  or lesion on the left labia. Uterus is enlarged (slightly). Uterus is not tender. Cervix exhibits no motion tenderness, no discharge and no friability. Right adnexum displays no mass and no tenderness. Left adnexum displays no mass and no tenderness. No vaginal discharge found.  Neurological: She is alert and oriented to person, place, and time.  Skin: Skin is warm and dry. No erythema.  Psychiatric: She has a normal mood and affect.   Results for orders placed or performed during the hospital encounter of 05/18/14 (from the past 24 hour(s))  Urinalysis, Routine w reflex microscopic     Status:  None   Collection Time: 05/18/14  3:55 PM  Result Value Ref Range   Color, Urine YELLOW YELLOW   APPearance CLEAR CLEAR   Specific Gravity, Urine 1.025 1.005 - 1.030   pH 7.0 5.0 - 8.0   Glucose, UA NEGATIVE NEGATIVE mg/dL   Hgb urine dipstick NEGATIVE NEGATIVE   Bilirubin Urine NEGATIVE NEGATIVE   Ketones, ur NEGATIVE NEGATIVE mg/dL   Protein, ur NEGATIVE NEGATIVE mg/dL   Urobilinogen, UA 0.2 0.0 - 1.0 mg/dL   Nitrite NEGATIVE NEGATIVE   Leukocytes, UA NEGATIVE NEGATIVE  Wet prep, genital     Status: Abnormal   Collection Time: 05/18/14  4:15 PM  Result Value Ref Range   Yeast Wet Prep HPF POC NONE SEEN NONE SEEN   Trich, Wet Prep NONE SEEN NONE SEEN   Clue Cells Wet Prep HPF POC NONE SEEN NONE SEEN   WBC, Wet Prep HPF POC FEW (A) NONE SEEN  ABO/Rh     Status: None (Preliminary result)   Collection Time: 05/18/14  4:25 PM  Result Value Ref Range   ABO/RH(D) O POS   CBC     Status: Abnormal   Collection Time: 05/18/14  4:25 PM  Result Value Ref Range   WBC 7.1 4.0 - 10.5 K/uL   RBC 4.87 3.87 - 5.11 MIL/uL   Hemoglobin 12.9 12.0 - 15.0 g/dL   HCT 40.937.8 81.136.0 - 91.446.0 %   MCV 77.6 (L) 78.0 - 100.0 fL   MCH 26.5 26.0 - 34.0 pg   MCHC 34.1 30.0 - 36.0 g/dL   RDW 78.214.6 95.611.5 - 21.315.5 %   Platelets 274 150 - 400 K/uL  hCG, quantitative, pregnancy     Status: Abnormal   Collection Time: 05/18/14  4:25 PM  Result Value Ref Range   hCG, Beta Chain, Quant, S [id number] (H) <5 mIU/mL   [place] Ob Comp Less 14 Wks  05/18/2014   CLINICAL DATA:  Abdominal pain.  Early pregnancy.  EXAM: OBSTETRIC <14 WK US AND TRANSVAGINAL OB US  TECHNIQUE: Both transabdominal and transvaginal ultrasound examinations were performed for complete evaluation of the gestation as well as the maternal uterus, adnexal regions, and pelvic cul-de-sac. Transvaginal technique was performed to assess early pregnancy.  COMPARISON:  None.  FINDINGS: Intrauterine gestational sac: Visualized and appears normal in shape.  Yolk sac:   Present and appears normal.  Embryo:  Single embryo present.  Cardiac Activity: Detected.  Heart Rate:  146 bpm  MSD:    mm    w     d  CRL:   11  mm   7 w 2 d                  US EDC: 01/02/2015  Maternal uterus/adnexae: The right ovary contains a corpus luteal cyst. No evidence of solid ovarian mass bilaterally or adnexal masses. No subchorionic hemorrhage  identified. No evidence of free pelvic fluid.  IMPRESSION: Unremarkable first trimester obstetric ultrasound demonstrating a single intrauterine gestation. Estimated gestational age is 7 weeks and 2 days. Normal cardiac activity is detected. Ultrasound EDC is 01/02/2015.   Electronically Signed   By: Irish Lack M.D.   On: 05/18/2014 17:31   US Ob Transvaginal  05/18/2014   CLINICAL DATA:  Abdominal pain.  Early pregnancy.  EXAM: OBSTETRIC <14 WK [place] AND TRANSVAGINAL OB US  TECHNIQUE: Both transabdominal and transvaginal ultrasound examinations were performed for complete evaluation of the gestation as well as the maternal uterus, adnexal regions, and pelvic cul-de-sac. Transvaginal technique was performed to assess early pregnancy.  COMPARISON:  None.  FINDINGS: Intrauterine gestational sac: Visualized and appears normal in shape.  Yolk sac:  Present and appears normal.  Embryo:  Single embryo present.  Cardiac Activity: Detected.  Heart Rate:  146 bpm  MSD:    mm    w     d  CRL:   11  mm   7 w 2 d                  [place] EDC: 01/02/2015  Maternal uterus/adnexae: The right ovary contains a corpus luteal cyst. No evidence of solid ovarian mass bilaterally or adnexal masses. No subchorionic hemorrhage identified. No evidence of free pelvic fluid.  IMPRESSION: Unremarkable first trimester obstetric ultrasound demonstrating a single intrauterine gestation. Estimated gestational age is 7 weeks and 2 days. Normal cardiac activity is detected. Ultrasound EDC is 01/02/2015.   Electronically Signed   By: Irish Lack M.D.   On: 05/18/2014 17:31    MAU Course   Procedures None  MDM + UPT on 05/01/14 UA, wet prep, GC/CHlamydia, CBC, ABO/Rh, quant hCG, HIV and [place] today  Assessment and Plan  A: SIUP at [redacted]w[redacted]d Abdominal pain in pregnancy  P: Discharge home Tylenol recommended PRN pain Patient advised to start prenatal care with [facility] as planned First trimester warning signs discussed Patient may return to MAU as needed or if her condition were to change or worsen   Marny Lowenstein, PA-C  05/18/2014, 5:39 PM

## 2014-05-19 LAB — GC/CHLAMYDIA PROBE AMP
CT PROBE, AMP APTIMA: NEGATIVE
GC PROBE AMP APTIMA: NEGATIVE

## 2014-05-19 LAB — ABO/RH: ABO/RH(D): O POS

## 2014-06-03 ENCOUNTER — Encounter (HOSPITAL_COMMUNITY): Payer: Self-pay | Admitting: Emergency Medicine

## 2014-06-03 ENCOUNTER — Emergency Department (HOSPITAL_COMMUNITY)
Admission: EM | Admit: 2014-06-03 | Discharge: 2014-06-03 | Disposition: A | Discharge status: Home / Self Care | Payer: Medicaid Other | Attending: Emergency Medicine | Admitting: Emergency Medicine

## 2014-06-03 DIAGNOSIS — O9989 Other specified diseases and conditions complicating pregnancy, childbirth and the puerperium: Secondary | ICD-10-CM | POA: Diagnosis not present

## 2014-06-03 DIAGNOSIS — O99511 Diseases of the respiratory system complicating pregnancy, first trimester: Secondary | ICD-10-CM | POA: Insufficient documentation

## 2014-06-03 DIAGNOSIS — Z3491 Encounter for supervision of normal pregnancy, unspecified, first trimester: Secondary | ICD-10-CM

## 2014-06-03 DIAGNOSIS — Z3A09 9 weeks gestation of pregnancy: Secondary | ICD-10-CM | POA: Insufficient documentation

## 2014-06-03 DIAGNOSIS — R110 Nausea: Secondary | ICD-10-CM | POA: Diagnosis not present

## 2014-06-03 DIAGNOSIS — J45909 Unspecified asthma, uncomplicated: Secondary | ICD-10-CM | POA: Insufficient documentation

## 2014-06-03 DIAGNOSIS — Z87891 Personal history of nicotine dependence: Secondary | ICD-10-CM | POA: Insufficient documentation

## 2014-06-03 DIAGNOSIS — Z79899 Other long term (current) drug therapy: Secondary | ICD-10-CM | POA: Insufficient documentation

## 2014-06-03 LAB — URINALYSIS, ROUTINE W REFLEX MICROSCOPIC
Bilirubin Urine: NEGATIVE
Glucose, UA: NEGATIVE mg/dL
HGB URINE DIPSTICK: NEGATIVE
Ketones, ur: NEGATIVE mg/dL
Leukocytes, UA: NEGATIVE
Nitrite: NEGATIVE
PROTEIN: NEGATIVE mg/dL
Specific Gravity, Urine: 1.035 — ABNORMAL HIGH (ref 1.005–1.030)
UROBILINOGEN UA: 0.2 mg/dL (ref 0.0–1.0)
pH: 6 (ref 5.0–8.0)

## 2014-06-03 LAB — POC URINE PREG, ED: Preg Test, Ur: POSITIVE — AB

## 2014-06-03 MED ORDER — SODIUM CHLORIDE 0.9 % IV BOLUS (SEPSIS)
1000.0000 mL | Freq: Once | INTRAVENOUS | Status: AC
  Administered 2014-06-03: 1000 mL via INTRAVENOUS

## 2014-06-03 MED ORDER — METOCLOPRAMIDE HCL 10 MG PO TABS: 10.0000 mg | ORAL_TABLET | Freq: Four times a day (QID) | ORAL | Status: DC

## 2014-06-03 MED ORDER — METOCLOPRAMIDE HCL 5 MG/ML IJ SOLN
10.0000 mg | Freq: Once | INTRAMUSCULAR | Status: AC
  Administered 2014-06-03: 10 mg via INTRAVENOUS
  Filled 2014-06-03: qty 2

## 2014-06-03 NOTE — ED Notes (Addendum)
Pt sts LMP was 10/12 and is currently pregnant; pt sts some nausea and dry heaves at times; pt G1; [facility] here [facility]

## 2014-06-03 NOTE — Discharge Instructions (Signed)
Take your prenatal vitamins.  Take your nausea medication (Reglan) as needed for nausea.

## 2014-06-03 NOTE — ED Notes (Signed)
Pt tolerated drinking ginger ale and reports decreased nausea.

## 2014-06-03 NOTE — ED Provider Notes (Signed)
CSN: [id number]     Arrival date & time 06/03/14  1804 History   First MD Initiated Contact with Patient 06/03/14 1847     Chief Complaint  Patient presents with  . Nausea     (Consider location/radiation/quality/duration/timing/severity/associated sxs/prior Treatment) HPI Comments: Patient who is currently [redacted] weeks pregnant presents today with a chief complaint of nausea.  She reports that she has been feeling nauseous for the past 2 weeks.  Nausea is worse in the morning, but continues throughout the day.  She has been "gagging", but no vomiting.  No abdominal pain, pelvic pain, vaginal bleeding, vaginal discharge, fever, chills, diarrhea, constipation, or urinary symptoms.  She has been drinking Ginger Ale to help with the nausea, but does not feel that it is helping.  She has not taken any medications for her symptoms.  She had an OB ultrasound done on 05/18/14, which confirmed an IUP.  She currently does not have a OB/GYN.  The history is provided by the patient.    Past Medical History  Diagnosis Date  . Asthma    Past Surgical History  Procedure Laterality Date  . No past surgeries     Family History  Problem Relation Age of Onset  . Anesthesia problems Neg Hx    History  Substance Use Topics  . Smoking status: Former Smoker -- 0.50 packs/day    Types: Cigarettes    Quit date: 04/19/2014  . Smokeless tobacco: Not on file  . Alcohol Use: No   OB History    Gravida Para Term Preterm AB TAB SAB Ectopic Multiple Living   2    1  1         Review of Systems  All other systems reviewed and are negative.     Allergies  Ciprofloxacin  Home Medications   Prior to Admission medications   Medication Sig Start Date End Date Taking? Authorizing Provider  albuterol (PROVENTIL HFA;VENTOLIN HFA) 108 (90 BASE) MCG/ACT inhaler Inhale 2 puffs into the lungs every 6 (six) hours as needed for wheezing or shortness of breath.    Historical Provider, MD  Prenatal Vit-Fe  Fumarate-FA (PRENATAL COMPLETE) 14-0.4 MG TABS Take 1 tablet by mouth 2 (two) times daily. 04/21/14   Tiffany Irine SealG Greene, PA-C   BP 118/65 mmHg  Pulse 98  Temp(Src) 98 F (36.7 C) (Oral)  Resp 16  Ht 5\' 5"  (1.651 m)  Wt 147 lb (66.679 kg)  BMI 24.46 kg/m2  SpO2 98%  LMP 03/30/2014 Physical Exam  Constitutional: She appears well-developed and well-nourished.  HENT:  Head: Normocephalic and atraumatic.  Mouth/Throat: Oropharynx is clear and moist.  Neck: Normal range of motion. Neck supple.  Cardiovascular: Normal rate, regular rhythm and normal heart sounds.   Pulmonary/Chest: Effort normal and breath sounds normal.  Abdominal: Soft. Bowel sounds are normal. She exhibits no distension and no mass. There is no tenderness. There is no rebound and no guarding.  Musculoskeletal: Normal range of motion.  Neurological: She is alert.  Skin: Skin is warm and dry.  Psychiatric: She has a normal mood and affect.  Nursing note and vitals reviewed.   ED Course  Procedures (including critical care time) Labs Review Labs Reviewed  URINALYSIS, ROUTINE W REFLEX MICROSCOPIC - Abnormal; Notable for the following:    Specific Gravity, Urine 1.035 (*)    All other components within normal limits  POC URINE PREG, ED - Abnormal; Notable for the following:    Preg Test, Ur POSITIVE (*)  All other components within normal limits    Imaging Review No results found.   EKG Interpretation None     7:46 PM  Reassessed patient.  She reports that nausea has improved.  Will fluid challenge and reassess. MDM   Final diagnoses:  None    Patient who is currently pregnant presents today with nausea.  She denies vomiting, abdominal pain, pelvic pain, fever, vaginal bleeding, or vaginal discharge.  On exam, no abdominal pain.  She had a OB ultrasound on 05/18/14, which confirmed an IUP.  Nausea improved while in the ED.  Patient able to tolerate PO liquids prior to discharge.  Feel that she is stable  for discharge.  She has been instructed to follow up with OB/GYN.  Return precautions given.      Santiago GladHeather Derrin Currey, PA-C 06/03/14 2205  Tilden FossaElizabeth Rees, MD 06/03/14 2240

## 2014-06-21 ENCOUNTER — Encounter (HOSPITAL_COMMUNITY): Payer: Self-pay | Admitting: *Deleted

## 2014-06-21 ENCOUNTER — Inpatient Hospital Stay (HOSPITAL_COMMUNITY)
Admission: AD | Admit: 2014-06-21 | Discharge: 2014-06-21 | Disposition: A | Discharge status: Home / Self Care | Payer: Medicaid Other | Source: Ambulatory Visit | Attending: Obstetrics & Gynecology | Admitting: Obstetrics & Gynecology

## 2014-06-21 DIAGNOSIS — W109XXA Fall (on) (from) unspecified stairs and steps, initial encounter: Secondary | ICD-10-CM | POA: Insufficient documentation

## 2014-06-21 DIAGNOSIS — O9989 Other specified diseases and conditions complicating pregnancy, childbirth and the puerperium: Secondary | ICD-10-CM | POA: Insufficient documentation

## 2014-06-21 DIAGNOSIS — Y9301 Activity, walking, marching and hiking: Secondary | ICD-10-CM | POA: Insufficient documentation

## 2014-06-21 DIAGNOSIS — Z87891 Personal history of nicotine dependence: Secondary | ICD-10-CM | POA: Insufficient documentation

## 2014-06-21 DIAGNOSIS — T1490 Injury, unspecified: Secondary | ICD-10-CM

## 2014-06-21 DIAGNOSIS — Z3A11 11 weeks gestation of pregnancy: Secondary | ICD-10-CM

## 2014-06-21 DIAGNOSIS — W108XXA Fall (on) (from) other stairs and steps, initial encounter: Secondary | ICD-10-CM

## 2014-06-21 NOTE — MAU Provider Note (Signed)
  History     CSN: [id number]  Arrival date and time: 06/21/14 1826   First Provider Initiated Contact with Patient 06/21/14 1903      Chief Complaint  Patient presents with  . Fall   HPI  Pt is G2P0010 at [redacted]w[redacted]d pregnant and presents after falling down 3 steps- hit back side. Pt has confirmed living IUP 05/18/2014 Pt denies spotting, bleeding or cramping Pt plans to move to [facility] next Friday.  Past Medical History  Diagnosis Date  . Asthma     Past Surgical History  Procedure Laterality Date  . No past surgeries      Family History  Problem Relation Age of Onset  . Anesthesia problems Neg Hx     History  Substance Use Topics  . Smoking status: Former Smoker -- 0.50 packs/day    Types: Cigarettes    Quit date: 04/19/2014  . Smokeless tobacco: Not on file  . Alcohol Use: No    Allergies:  Allergies  Allergen Reactions  . Ciprofloxacin Anaphylaxis and Other (See Comments)    Gets really hot.    Prescriptions prior to admission  Medication Sig Dispense Refill Last Dose  . metoCLOPramide (REGLAN) 10 MG tablet Take 1 tablet (10 mg total) by mouth every 6 (six) hours. 30 tablet 0   . Prenatal Vit-Fe Fumarate-FA (PRENATAL COMPLETE) 14-0.4 MG TABS Take 1 tablet by mouth 2 (two) times daily. 60 each 2 05/18/2014 at Unknown time    Review of Systems  Constitutional: Negative for fever and chills.  Gastrointestinal: Negative for nausea, vomiting, abdominal pain and diarrhea.  Genitourinary: Positive for frequency. Negative for dysuria and urgency.   Physical Exam   Blood pressure 123/76, temperature 98.5 F (36.9 C), temperature source Oral, resp. rate 18, last menstrual period 03/30/2014, unknown if currently breastfeeding.  Physical Exam  Constitutional: She is oriented to person, place, and time. She appears well-developed and well-nourished. No distress.  HENT:  Head: Normocephalic.  Eyes: Pupils are equal, round, and reactive to light.  Neck: Normal  range of motion. Neck supple.  Cardiovascular: Normal rate.   GI: Soft. She exhibits no distension. There is no tenderness.  Musculoskeletal: Normal range of motion.  Neurological: She is alert and oriented to person, place, and time.  Skin: Skin is warm and dry.  Psychiatric: She has a normal mood and affect.    MAU Course  Procedures   Assessment and Plan  Fall in pregnancy Viable IUP [redacted]w[redacted]d F/u with prenatal care  Shigeko Manard 06/21/2014, 7:05 PM

## 2014-06-21 NOTE — Discharge Instructions (Signed)
What Do I Need to Know About Injuries During Pregnancy? °Injuries can happen during pregnancy. Minor falls and accidents usually do not harm you or your baby. However, any injury should be reported to your doctor. °WHAT CAN I DO TO PROTECT MYSELF FROM INJURIES? °· Remove rugs and loose objects on the floor. °· Wear comfortable shoes that have a good grip. Do not wear high-heeled shoes. °· Always wear your seat belt. The lap belt should be below your belly. Always practice safe driving. °· Do not ride on a motorcycle. °· Do not participate in high-impact activities or sports. °· Avoid: °¨ Walking on wet or slippery floors. °¨ Fires. °¨ Starting fires. °¨ Lifting heavy pots of boiling or hot liquids. °¨ Fixing electrical problems. °· Only take medicine as told by your doctor. °· Know your blood type and the blood type of the baby's father. °· Call your local emergency services (911 in the [place] if you are a victim of domestic violence or assault. For help and support, contact the National Domestic Violence Hotline. °WHEN SHOULD I GET HELP RIGHT AWAY? °· You fall on your belly or have any high-impact accident or injury. °· You have been a victim of domestic violence or any kind of violence. °· You have been in a car accident. °· You have bleeding from your vagina. °· Fluid is leaking from your vagina. °· You start to have belly cramping (contractions) or pain. °· You feel weak or pass out (faint). °· You start to throw up (vomit) after an injury. °· You have been burned. °· You have a stiff neck or neck pain. °· You get a headache or have vision problems after an injury. °· You do not feel the baby move or the baby is not moving as much as normal. °Document Released: 07/08/2010 Document Revised: 10/20/2013 Document Reviewed: 03/12/2013 °ExitCare® Patient Information ©2015 [employer]. This information is not intended to replace advice given to you by your health care provider. Make sure you discuss any questions you  have with your health care provider. ° °

## 2014-06-21 NOTE — MAU Note (Signed)
Pt reports falling on stairs on her way out of work

## 2015-03-06 ENCOUNTER — Encounter (HOSPITAL_COMMUNITY): Payer: Self-pay | Admitting: *Deleted

## 2016-04-24 ENCOUNTER — Encounter (HOSPITAL_COMMUNITY): Payer: Self-pay | Admitting: *Deleted

## 2016-04-24 ENCOUNTER — Inpatient Hospital Stay (HOSPITAL_COMMUNITY)
Admission: AD | Admit: 2016-04-24 | Discharge: 2016-04-24 | Disposition: A | Discharge status: Home / Self Care | Payer: Medicaid Other | Source: Ambulatory Visit | Attending: Family Medicine | Admitting: Family Medicine

## 2016-04-24 DIAGNOSIS — Z87891 Personal history of nicotine dependence: Secondary | ICD-10-CM | POA: Insufficient documentation

## 2016-04-24 DIAGNOSIS — O26892 Other specified pregnancy related conditions, second trimester: Secondary | ICD-10-CM | POA: Insufficient documentation

## 2016-04-24 DIAGNOSIS — R102 Pelvic and perineal pain: Secondary | ICD-10-CM | POA: Diagnosis not present

## 2016-04-24 DIAGNOSIS — N949 Unspecified condition associated with female genital organs and menstrual cycle: Secondary | ICD-10-CM

## 2016-04-24 DIAGNOSIS — R1032 Left lower quadrant pain: Secondary | ICD-10-CM | POA: Diagnosis present

## 2016-04-24 DIAGNOSIS — O9989 Other specified diseases and conditions complicating pregnancy, childbirth and the puerperium: Secondary | ICD-10-CM | POA: Diagnosis not present

## 2016-04-24 DIAGNOSIS — Z3A22 22 weeks gestation of pregnancy: Secondary | ICD-10-CM | POA: Insufficient documentation

## 2016-04-24 DIAGNOSIS — Z881 Allergy status to other antibiotic agents status: Secondary | ICD-10-CM | POA: Insufficient documentation

## 2016-04-24 LAB — URINALYSIS, ROUTINE W REFLEX MICROSCOPIC
Bilirubin Urine: NEGATIVE
Glucose, UA: NEGATIVE mg/dL
HGB URINE DIPSTICK: NEGATIVE
Ketones, ur: NEGATIVE mg/dL
Nitrite: NEGATIVE
PH: 6 (ref 5.0–8.0)
Protein, ur: NEGATIVE mg/dL
Specific Gravity, Urine: >1.03 — ABNORMAL HIGH (ref 1.005–1.030)

## 2016-04-24 LAB — URINE MICROSCOPIC-ADD ON

## 2016-04-24 NOTE — MAU Provider Note (Signed)
Obstetric Attending MAU Note  Chief Complaint:  Abdominal Pain    HPI: Anne Nguyen is a 25 y.o. G3P0011 at 6667w4d who presents to maternity admissions reporting left lower quadrant pain x 1 day. Worse with movement, improved with rest. Denies contractions, leakage of fluid or vaginal bleeding. Good fetal movement.   Pregnancy Course: Receives care in [place]. Recently moved back to town.   Past Medical History:  Diagnosis Date  . Asthma     OB History  Gravida Para Term Preterm AB Living  3       1 1   SAB TAB Ectopic Multiple Live Births  1            # Outcome Date GA Lbr Len/2nd Weight Sex Delivery Anes PTL Lv  3 Current           2 Gravida           1 SAB              Birth Comments: System Generated. Please review and update pregnancy details.      Past Surgical History:  Procedure Laterality Date  . NO PAST SURGERIES      Family History: Family History  Problem Relation Age of Onset  . Anesthesia problems Neg Hx     Social History: Social History  Substance Use Topics  . Smoking status: Former Smoker    Packs/day: 0.50    Types: Cigarettes    Quit date: 04/19/2014  . Smokeless tobacco: Former NeurosurgeonUser  . Alcohol use No    Allergies:  Allergies  Allergen Reactions  . Ciprofloxacin Anaphylaxis and Other (See Comments)    Gets really hot.    Prescriptions Prior to Admission  Medication Sig Dispense Refill Last Dose  . metoCLOPramide (REGLAN) 10 MG tablet Take 1 tablet (10 mg total) by mouth every 6 (six) hours. 30 tablet 0   . Prenatal Vit-Fe Fumarate-FA (PRENATAL COMPLETE) 14-0.4 MG TABS Take 1 tablet by mouth 2 (two) times daily. 60 each 2 05/18/2014 at Unknown time    ROS: Pertinent findings in history of present illness.  Physical Exam  Blood pressure 112/66, pulse 100, temperature 97.9 F (36.6 C), temperature source Oral, resp. rate 16, weight 138 lb 3.2 oz (62.7 kg), unknown if currently breastfeeding. CONSTITUTIONAL: Well-developed,  well-nourished female in no acute distress.  HENT:  Normocephalic, atraumatic EYES: Conjunctivae and EOM are normal. No scleral icterus.  NECK: Normal range of motion, supple, no masses SKIN: Skin is warm and dry. No rash noted. Not diaphoretic. No erythema. No pallor. NEUROLGIC: Alert and oriented to person, place, and time. No cranial nerve deficit noted. PSYCHIATRIC: Normal mood and affect. Normal behavior. Normal judgment and thought content. CARDIOVASCULAR: Normal heart rate noted, regular rhythm RESPIRATORY: Effort normal, no problems with respiration noted ABDOMEN: Soft, nontender, nondistended, gravid appropriate for gestational age MUSCULOSKELETAL: Normal range of motion. No edema and no tenderness. 2+ distal pulses.  Vaginal EXAM: NEFG, physiologic discharge, no blood, cervix clean, external os permits 1 finger, internal os is closed closed internally, soft, posterior, unable to palpate presenting part.     Labs: Results for orders placed or performed during the hospital encounter of 04/24/16 (from the past 24 hour(s))  Urinalysis, Routine w reflex microscopic (not [facility])     Status: Abnormal   Collection Time: 04/24/16  4:35 PM  Result Value Ref Range   Color, Urine YELLOW YELLOW   APPearance CLEAR CLEAR   Specific Gravity, Urine >1.030 (H) 1.005 -  1.030   pH 6.0 5.0 - 8.0   Glucose, UA NEGATIVE NEGATIVE mg/dL   Hgb urine dipstick NEGATIVE NEGATIVE   Bilirubin Urine NEGATIVE NEGATIVE   Ketones, ur NEGATIVE NEGATIVE mg/dL   Protein, ur NEGATIVE NEGATIVE mg/dL   Nitrite NEGATIVE NEGATIVE   Leukocytes, UA SMALL (A) NEGATIVE  Urine microscopic-add on     Status: Abnormal   Collection Time: 04/24/16  4:35 PM  Result Value Ref Range   Squamous Epithelial / LPF 6-30 (A) NONE SEEN   WBC, UA 6-30 0 - 5 WBC/hpf   RBC / HPF 0-5 0 - 5 RBC/hpf   Bacteria, UA MANY (A) NONE SEEN    Assessment: 1. Round ligament pain     Plan: Discharge home Labor precautions and fetal kick  counts reviewed Follow up with OB provider  Follow-up Information    [facility] Follow up in 2 week(s).   Specialty:  Obstetrics and Gynecology Why:  They will call you with appointment Contact information: [address] [place] [address] [phone]            Medication List    TAKE these medications   metoCLOPramide 10 MG tablet Commonly known as:  REGLAN Take 1 tablet (10 mg total) by mouth every 6 (six) hours.   PRENATAL COMPLETE 14-0.4 MG Tabs Take 1 tablet by mouth 2 (two) times daily.       Reva Bores, MD 04/24/2016 7:08 PM

## 2016-04-24 NOTE — MAU Note (Addendum)
Less nights started having sharp pains in lower abd.  Was able to sleep last night.  Started again this morning, has been off and on, feels like minor contractions.  Recently located from [place]  Getting care there.  Denies urinary or GI problems

## 2016-04-24 NOTE — Discharge Instructions (Signed)
Round Ligament Pain  The round ligament is a cord of muscle and tissue that helps to support the uterus. It can become a source of pain during pregnancy if it becomes stretched or twisted as the baby grows. The pain usually begins in the second trimester of pregnancy, and it can come and go until the baby is delivered. It is not a serious problem, and it does not cause harm to the baby.  Round ligament pain is usually a short, sharp, and pinching pain, but it can also be a dull, lingering, and aching pain. The pain is felt in the lower side of the abdomen or in the groin. It usually starts deep in the groin and moves up to the outside of the hip area. Pain can occur with:   A sudden change in position.   Rolling over in bed.   Coughing or sneezing.   Physical activity.  HOME CARE INSTRUCTIONS  Watch your condition for any changes. Take these steps to help with your pain:   When the pain starts, relax. Then try:    Sitting down.    Flexing your knees up to your abdomen.    Lying on your side with one pillow under your abdomen and another pillow between your legs.    Sitting in a warm bath for 15-20 minutes or until the pain goes away.   Take over-the-counter and prescription medicines only as told by your health care provider.   Move slowly when you sit and stand.   Avoid long walks if they cause pain.   Stop or lessen your physical activities if they cause pain.  SEEK MEDICAL CARE IF:   Your pain does not go away with treatment.   You feel pain in your back that you did not have before.   Your medicine is not helping.  SEEK IMMEDIATE MEDICAL CARE IF:   You develop a fever or chills.   You develop uterine contractions.   You develop vaginal bleeding.   You develop nausea or vomiting.   You develop diarrhea.   You have pain when you urinate.     This information is not intended to replace advice given to you by your health care provider. Make sure you discuss any questions you have with your health  care provider.     Document Released: 03/14/2008 Document Revised: 08/28/2011 Document Reviewed: 08/12/2014  Elsevier Interactive Patient Education 2016 [employer]

## 2016-05-15 ENCOUNTER — Ambulatory Visit (INDEPENDENT_AMBULATORY_CARE_PROVIDER_SITE_OTHER): Payer: Medicaid Other | Admitting: Family Medicine

## 2016-05-15 ENCOUNTER — Encounter: Payer: Self-pay | Admitting: Family Medicine

## 2016-05-15 VITALS — BP 109/61 | HR 86 | Wt 143.0 lb

## 2016-05-15 DIAGNOSIS — J4520 Mild intermittent asthma, uncomplicated: Secondary | ICD-10-CM

## 2016-05-15 DIAGNOSIS — Z3482 Encounter for supervision of other normal pregnancy, second trimester: Secondary | ICD-10-CM

## 2016-05-15 DIAGNOSIS — Z3480 Encounter for supervision of other normal pregnancy, unspecified trimester: Secondary | ICD-10-CM

## 2016-05-15 DIAGNOSIS — Z3490 Encounter for supervision of normal pregnancy, unspecified, unspecified trimester: Secondary | ICD-10-CM | POA: Insufficient documentation

## 2016-05-15 MED ORDER — ALBUTEROL SULFATE HFA 108 (90 BASE) MCG/ACT IN AERS: 2.0000 | INHALATION_SPRAY | Freq: Four times a day (QID) | RESPIRATORY_TRACT | 2 refills | Status: AC | PRN

## 2016-05-15 NOTE — Progress Notes (Signed)
[facility]

## 2016-05-15 NOTE — Patient Instructions (Signed)
Second Trimester of Pregnancy The second trimester is from week 13 through week 28 (months 4 through 6). The second trimester is often a time when you feel your best. Your body has also adjusted to being pregnant, and you begin to feel better physically. Usually, morning sickness has lessened or quit completely, you may have more energy, and you may have an increase in appetite. The second trimester is also a time when the fetus is growing rapidly. At the end of the sixth month, the fetus is about 9 inches long and weighs about 1 pounds. You will likely begin to feel the baby move (quickening) between 18 and 20 weeks of the pregnancy. Body changes during your second trimester Your body continues to go through many changes during your second trimester. The changes vary from woman to woman.  Your weight will continue to increase. You will notice your lower abdomen bulging out.  You may begin to get stretch marks on your hips, abdomen, and breasts.  You may develop headaches that can be relieved by medicines. The medicines should be approved by your health care provider.  You may urinate more often because the fetus is pressing on your bladder.  You may develop or continue to have heartburn as a result of your pregnancy.  You may develop constipation because certain hormones are causing the muscles that push waste through your intestines to slow down.  You may develop hemorrhoids or swollen, bulging veins (varicose veins).  You may have back pain. This is caused by:  Weight gain.  Pregnancy hormones that are relaxing the joints in your pelvis.  A shift in weight and the muscles that support your balance.  Your breasts will continue to grow and they will continue to become tender.  Your gums may bleed and may be sensitive to brushing and flossing.  Dark spots or blotches (chloasma, mask of pregnancy) may develop on your face. This will likely fade after the baby is born.  A dark line  from your belly button to the pubic area (linea nigra) may appear. This will likely fade after the baby is born.  You may have changes in your hair. These can include thickening of your hair, rapid growth, and changes in texture. Some women also have hair loss during or after pregnancy, or hair that feels dry or thin. Your hair will most likely return to normal after your baby is born. What to expect at prenatal visits During a routine prenatal visit:  You will be weighed to make sure you and the fetus are growing normally.  Your blood pressure will be taken.  Your abdomen will be measured to track your baby's growth.  The fetal heartbeat will be listened to.  Any test results from the previous visit will be discussed. Your health care provider may ask you:  How you are feeling.  If you are feeling the baby move.  If you have had any abnormal symptoms, such as leaking fluid, bleeding, severe headaches, or abdominal cramping.  If you are using any tobacco products, including cigarettes, chewing tobacco, and electronic cigarettes.  If you have any questions. Other tests that may be performed during your second trimester include:  Blood tests that check for:  Low iron levels (anemia).  Gestational diabetes (between 24 and 28 weeks).  Rh antibodies. This is to check for a protein on red blood cells (Rh factor).  Urine tests to check for infections, diabetes, or protein in the urine.  An ultrasound to   confirm the proper growth and development of the baby.  An amniocentesis to check for possible genetic problems.  Fetal screens for spina bifida and Down syndrome.  HIV (human immunodeficiency virus) testing. Routine prenatal testing includes screening for HIV, unless you choose not to have this test. Follow these instructions at home: Eating and drinking  Continue to eat regular, healthy meals.  Avoid raw meat, uncooked cheese, cat litter boxes, and soil used by cats. These  carry germs that can cause birth defects in the baby.  Take your prenatal vitamins.  Take 1500-2000 mg of calcium daily starting at the 20th week of pregnancy until you deliver your baby.  If you develop constipation:  Take over-the-counter or prescription medicines.  Drink enough fluid to keep your urine clear or pale yellow.  Eat foods that are high in fiber, such as fresh fruits and vegetables, whole grains, and beans.  Limit foods that are high in fat and processed sugars, such as fried and sweet foods. Activity  Exercise only as directed by your health care provider. Experiencing uterine cramps is a good sign to stop exercising.  Avoid heavy lifting, wear low heel shoes, and practice good posture.  Wear your seat belt at all times when driving.  Rest with your legs elevated if you have leg cramps or low back pain.  Wear a good support bra for breast tenderness.  Do not use hot tubs, steam rooms, or saunas. Lifestyle  Avoid all smoking, herbs, alcohol, and unprescribed drugs. These chemicals affect the formation and growth of the baby.  Do not use any products that contain nicotine or tobacco, such as cigarettes and e-cigarettes. If you need help quitting, ask your health care provider.  A sexual relationship may be continued unless your health care provider directs you otherwise. General instructions  Follow your health care provider's instructions regarding medicine use. There are medicines that are either safe or unsafe to take during pregnancy.  Take warm sitz baths to soothe any pain or discomfort caused by hemorrhoids. Use hemorrhoid cream if your health care provider approves.  If you develop varicose veins, wear support hose. Elevate your feet for 15 minutes, 3-4 times a day. Limit salt in your diet.  Visit your dentist if you have not gone yet during your pregnancy. Use a soft toothbrush to brush your teeth and be gentle when you floss.  Keep all follow-up  prenatal visits as told by your health care provider. This is important. Contact a health care provider if:  You have dizziness.  You have mild pelvic cramps, pelvic pressure, or nagging pain in the abdominal area.  You have persistent nausea, vomiting, or diarrhea.  You have a bad smelling vaginal discharge.  You have pain with urination. Get help right away if:  You have a fever.  You are leaking fluid from your vagina.  You have spotting or bleeding from your vagina.  You have severe abdominal cramping or pain.  You have rapid weight gain or weight loss.  You have shortness of breath with chest pain.  You notice sudden or extreme swelling of your face, hands, ankles, feet, or legs.  You have not felt your baby move in over an hour.  You have severe headaches that do not go away with medicine.  You have vision changes. Summary  The second trimester is from week 13 through week 28 (months 4 through 6). It is also a time when the fetus is growing rapidly.  Your body goes   through many changes during pregnancy. The changes vary from woman to woman.  Avoid all smoking, herbs, alcohol, and unprescribed drugs. These chemicals affect the formation and growth your baby.  Do not use any tobacco products, such as cigarettes, chewing tobacco, and e-cigarettes. If you need help quitting, ask your health care provider.  Contact your health care provider if you have any questions. Keep all prenatal visits as told by your health care provider. This is important. This information is not intended to replace advice given to you by your health care provider. Make sure you discuss any questions you have with your health care provider. Document Released: 05/30/2001 Document Revised: 11/11/2015 Document Reviewed: 08/06/2012 Elsevier Interactive Patient Education  2017 [employer]   Breastfeeding Deciding to breastfeed is one of the best choices you can make for you and your baby. A  change in hormones during pregnancy causes your breast tissue to grow and increases the number and size of your milk ducts. These hormones also allow proteins, sugars, and fats from your blood supply to make breast milk in your milk-producing glands. Hormones prevent breast milk from being released before your baby is born as well as prompt milk flow after birth. Once breastfeeding has begun, thoughts of your baby, as well as his or her sucking or crying, can stimulate the release of milk from your milk-producing glands. Benefits of breastfeeding For Your Baby  Your first milk (colostrum) helps your baby's digestive system function better.  There are antibodies in your milk that help your baby fight off infections.  Your baby has a lower incidence of asthma, allergies, and sudden infant death syndrome.  The nutrients in breast milk are better for your baby than infant formulas and are designed uniquely for your baby's needs.  Breast milk improves your baby's brain development.  Your baby is less likely to develop other conditions, such as childhood obesity, asthma, or type 2 diabetes mellitus. For You  Breastfeeding helps to create a very special bond between you and your baby.  Breastfeeding is convenient. Breast milk is always available at the correct temperature and costs nothing.  Breastfeeding helps to burn calories and helps you lose the weight gained during pregnancy.  Breastfeeding makes your uterus contract to its prepregnancy size faster and slows bleeding (lochia) after you give birth.  Breastfeeding helps to lower your risk of developing type 2 diabetes mellitus, osteoporosis, and breast or ovarian cancer later in life. Signs that your baby is hungry Early Signs of Hunger  Increased alertness or activity.  Stretching.  Movement of the head from side to side.  Movement of the head and opening of the mouth when the corner of the mouth or cheek is stroked  (rooting).  Increased sucking sounds, smacking lips, cooing, sighing, or squeaking.  Hand-to-mouth movements.  Increased sucking of fingers or hands. Late Signs of Hunger  Fussing.  Intermittent crying. Extreme Signs of Hunger  Signs of extreme hunger will require calming and consoling before your baby will be able to breastfeed successfully. Do not wait for the following signs of extreme hunger to occur before you initiate breastfeeding:  Restlessness.  A loud, strong cry.  Screaming. Breastfeeding basics  Breastfeeding Initiation  Find a comfortable place to sit or lie down, with your neck and back well supported.  Place a pillow or rolled up blanket under your baby to bring him or her to the level of your breast (if you are seated). Nursing pillows are specially designed to help   support your arms and your baby while you breastfeed.  Make sure that your baby's abdomen is facing your abdomen.  Gently massage your breast. With your fingertips, massage from your chest wall toward your nipple in a circular motion. This encourages milk flow. You may need to continue this action during the feeding if your milk flows slowly.  Support your breast with 4 fingers underneath and your thumb above your nipple. Make sure your fingers are well away from your nipple and your baby's mouth.  Stroke your baby's lips gently with your finger or nipple.  When your baby's mouth is open wide enough, quickly bring your baby to your breast, placing your entire nipple and as much of the colored area around your nipple (areola) as possible into your baby's mouth.  More areola should be visible above your baby's upper lip than below the lower lip.  Your baby's tongue should be between his or her lower gum and your breast.  Ensure that your baby's mouth is correctly positioned around your nipple (latched). Your baby's lips should create a seal on your breast and be turned out (everted).  It is common  for your baby to suck about 2-3 minutes in order to start the flow of breast milk. Latching  Teaching your baby how to latch on to your breast properly is very important. An improper latch can cause nipple pain and decreased milk supply for you and poor weight gain in your baby. Also, if your baby is not latched onto your nipple properly, he or she may swallow some air during feeding. This can make your baby fussy. Burping your baby when you switch breasts during the feeding can help to get rid of the air. However, teaching your baby to latch on properly is still the best way to prevent fussiness from swallowing air while breastfeeding. Signs that your baby has successfully latched on to your nipple:  Silent tugging or silent sucking, without causing you pain.  Swallowing heard between every 3-4 sucks.  Muscle movement above and in front of his or her ears while sucking. Signs that your baby has not successfully latched on to nipple:  Sucking sounds or smacking sounds from your baby while breastfeeding.  Nipple pain. If you think your baby has not latched on correctly, slip your finger into the corner of your baby's mouth to break the suction and place it between your baby's gums. Attempt breastfeeding initiation again. Signs of Successful Breastfeeding  Signs from your baby:  A gradual decrease in the number of sucks or complete cessation of sucking.  Falling asleep.  Relaxation of his or her body.  Retention of a small amount of milk in his or her mouth.  Letting go of your breast by himself or herself. Signs from you:  Breasts that have increased in firmness, weight, and size 1-3 hours after feeding.  Breasts that are softer immediately after breastfeeding.  Increased milk volume, as well as a change in milk consistency and color by the fifth day of breastfeeding.  Nipples that are not sore, cracked, or bleeding. Signs That Your Baby is Getting Enough Milk  Wetting at least  1-2 diapers during the first 24 hours after birth.  Wetting at least 5-6 diapers every 24 hours for the first week after birth. The urine should be clear or pale yellow by 5 days after birth.  Wetting 6-8 diapers every 24 hours as your baby continues to grow and develop.  At least 3 stools in   a 24-hour period by age 5 days. The stool should be soft and yellow.  At least 3 stools in a 24-hour period by age 7 days. The stool should be seedy and yellow.  No loss of weight greater than 10% of birth weight during the first 3 days of age.  Average weight gain of 4-7 ounces (113-198 g) per week after age 4 days.  Consistent daily weight gain by age 5 days, without weight loss after the age of 2 weeks. After a feeding, your baby may spit up a small amount. This is common. Breastfeeding frequency and duration Frequent feeding will help you make more milk and can prevent sore nipples and breast engorgement. Breastfeed when you feel the need to reduce the fullness of your breasts or when your baby shows signs of hunger. This is called "breastfeeding on demand." Avoid introducing a pacifier to your baby while you are working to establish breastfeeding (the first 4-6 weeks after your baby is born). After this time you may choose to use a pacifier. Research has shown that pacifier use during the first year of a baby's life decreases the risk of sudden infant death syndrome (SIDS). Allow your baby to feed on each breast as long as he or she wants. Breastfeed until your baby is finished feeding. When your baby unlatches or falls asleep while feeding from the first breast, offer the second breast. Because newborns are often sleepy in the first few weeks of life, you may need to awaken your baby to get him or her to feed. Breastfeeding times will vary from baby to baby. However, the following rules can serve as a guide to help you ensure that your baby is properly fed:  Newborns (babies 4 weeks of age or younger)  may breastfeed every 1-3 hours.  Newborns should not go longer than 3 hours during the day or 5 hours during the night without breastfeeding.  You should breastfeed your baby a minimum of 8 times in a 24-hour period until you begin to introduce solid foods to your baby at around 6 months of age. Breast milk pumping Pumping and storing breast milk allows you to ensure that your baby is exclusively fed your breast milk, even at times when you are unable to breastfeed. This is especially important if you are going back to work while you are still breastfeeding or when you are not able to be present during feedings. Your lactation consultant can give you guidelines on how long it is safe to store breast milk. A breast pump is a machine that allows you to pump milk from your breast into a sterile bottle. The pumped breast milk can then be stored in a refrigerator or freezer. Some breast pumps are operated by hand, while others use electricity. Ask your lactation consultant which type will work best for you. Breast pumps can be purchased, but some hospitals and breastfeeding support groups lease breast pumps on a monthly basis. A lactation consultant can teach you how to hand express breast milk, if you prefer not to use a pump. Caring for your breasts while you breastfeed Nipples can become dry, cracked, and sore while breastfeeding. The following recommendations can help keep your breasts moisturized and healthy:  Avoid using soap on your nipples.  Wear a supportive bra. Although not required, special nursing bras and tank tops are designed to allow access to your breasts for breastfeeding without taking off your entire bra or top. Avoid wearing underwire-style bras or extremely tight   bras.  Air dry your nipples for 3-4minutes after each feeding.  Use only cotton bra pads to absorb leaked breast milk. Leaking of breast milk between feedings is normal.  Use lanolin on your nipples after breastfeeding.  Lanolin helps to maintain your skin's normal moisture barrier. If you use pure lanolin, you do not need to wash it off before feeding your baby again. Pure lanolin is not toxic to your baby. You may also hand express a few drops of breast milk and gently massage that milk into your nipples and allow the milk to air dry. In the first few weeks after giving birth, some women experience extremely full breasts (engorgement). Engorgement can make your breasts feel heavy, warm, and tender to the touch. Engorgement peaks within 3-5 days after you give birth. The following recommendations can help ease engorgement:  Completely empty your breasts while breastfeeding or pumping. You may want to start by applying warm, moist heat (in the shower or with warm water-soaked hand towels) just before feeding or pumping. This increases circulation and helps the milk flow. If your baby does not completely empty your breasts while breastfeeding, pump any extra milk after he or she is finished.  Wear a snug bra (nursing or regular) or tank top for 1-2 days to signal your body to slightly decrease milk production.  Apply ice packs to your breasts, unless this is too uncomfortable for you.  Make sure that your baby is latched on and positioned properly while breastfeeding. If engorgement persists after 48 hours of following these recommendations, contact your health care provider or a lactation consultant. Overall health care recommendations while breastfeeding  Eat healthy foods. Alternate between meals and snacks, eating 3 of each per day. Because what you eat affects your breast milk, some of the foods may make your baby more irritable than usual. Avoid eating these foods if you are sure that they are negatively affecting your baby.  Drink milk, fruit juice, and water to satisfy your thirst (about 10 glasses a day).  Rest often, relax, and continue to take your prenatal vitamins to prevent fatigue, stress, and  anemia.  Continue breast self-awareness checks.  Avoid chewing and smoking tobacco. Chemicals from cigarettes that pass into breast milk and exposure to secondhand smoke may harm your baby.  Avoid alcohol and drug use, including marijuana. Some medicines that may be harmful to your baby can pass through breast milk. It is important to ask your health care provider before taking any medicine, including all over-the-counter and prescription medicine as well as vitamin and herbal supplements. It is possible to become pregnant while breastfeeding. If birth control is desired, ask your health care provider about options that will be safe for your baby. Contact a health care provider if:  You feel like you want to stop breastfeeding or have become frustrated with breastfeeding.  You have painful breasts or nipples.  Your nipples are cracked or bleeding.  Your breasts are red, tender, or warm.  You have a swollen area on either breast.  You have a fever or chills.  You have nausea or vomiting.  You have drainage other than breast milk from your nipples.  Your breasts do not become full before feedings by the fifth day after you give birth.  You feel sad and depressed.  Your baby is too sleepy to eat well.  Your baby is having trouble sleeping.  Your baby is wetting less than 3 diapers in a 24-hour period.  Your baby   has less than 3 stools in a 24-hour period.  Your baby's skin or the white part of his or her eyes becomes yellow.  Your baby is not gaining weight by 5 days of age. Get help right away if:  Your baby is overly tired (lethargic) and does not want to wake up and feed.  Your baby develops an unexplained fever. This information is not intended to replace advice given to you by your health care provider. Make sure you discuss any questions you have with your health care provider. Document Released: 06/05/2005 Document Revised: 11/17/2015 Document Reviewed:  11/27/2012 Elsevier Interactive Patient Education  2017 [employer]  

## 2016-05-15 NOTE — Progress Notes (Signed)
   PRENATAL VISIT NOTE  Subjective:  Anne Nguyen is a 25 y.o. G3P1011 at 141w4d being seen today for transferring prenatal care. Care in [place], reports normal u/s but incomplete.  She is currently monitored for the following issues for this low-risk pregnancy and has Asthma, mild intermittent and Supervision of normal pregnancy, antepartum on her problem list.  Patient reports no complaints.  Contractions: Not present. Vag. Bleeding: None.  Movement: Present. Denies leaking of fluid.   The following portions of the patient's history were reviewed and updated as appropriate: allergies, current medications, past family history, past medical history, past social history, past surgical history and problem list. Problem list updated.  Objective:   Vitals:   05/15/16 0840  BP: 109/61  Pulse: 86  Weight: 143 lb (64.9 kg)    Fetal Status: Fetal Heart Rate (bpm): 140   Movement: Present     General:  Alert, oriented and cooperative. Patient is in no acute distress.  Skin: Skin is warm and dry. No rash noted.   Cardiovascular: Normal heart rate noted  Respiratory: Normal respiratory effort, no problems with respiration noted  Abdomen: Soft, gravid, appropriate for gestational age. Pain/Pressure: Absent     Pelvic:  Cervical exam deferred        Extremities: Normal range of motion.  Edema: None  Mental Status: Normal mood and affect. Normal behavior. Normal judgment and thought content.   Assessment and Plan:  Pregnancy: G3P1011 at 361w4d  1. Mild intermittent asthma without complication - albuterol (PROVENTIL HFA;VENTOLIN HFA) 108 (90 Base) MCG/ACT inhaler; Inhale 2 puffs into the lungs every 6 (six) hours as needed for wheezing or shortness of breath.  Dispense: 1 Inhaler; Refill: 2  2. Supervision of other normal pregnancy, antepartum Continue routine prenatal care. Declines flu shot 28 wk labs next visit - US MFM OB COMP + 14 WK; Future  Preterm labor symptoms and general obstetric  precautions including but not limited to vaginal bleeding, contractions, leaking of fluid and fetal movement were reviewed in detail with the patient. Please refer to After Visit Summary for other counseling recommendations.  Return in 3 weeks (on 06/05/2016) for 28 wk labs.   Reva Boresanya S Husna Krone, MD

## 2016-05-17 ENCOUNTER — Encounter (HOSPITAL_COMMUNITY): Payer: Self-pay | Admitting: Family Medicine

## 2016-05-25 ENCOUNTER — Ambulatory Visit (HOSPITAL_COMMUNITY)
Admission: RE | Admit: 2016-05-25 | Discharge: 2016-05-25 | Disposition: A | Discharge status: Home / Self Care | Payer: Medicaid Other | Source: Ambulatory Visit | Attending: Family Medicine | Admitting: Family Medicine

## 2016-05-25 DIAGNOSIS — Z3A27 27 weeks gestation of pregnancy: Secondary | ICD-10-CM | POA: Insufficient documentation

## 2016-05-25 DIAGNOSIS — Z363 Encounter for antenatal screening for malformations: Secondary | ICD-10-CM | POA: Diagnosis present

## 2016-05-25 DIAGNOSIS — Z3480 Encounter for supervision of other normal pregnancy, unspecified trimester: Secondary | ICD-10-CM

## 2016-06-05 ENCOUNTER — Ambulatory Visit (INDEPENDENT_AMBULATORY_CARE_PROVIDER_SITE_OTHER): Payer: Medicaid Other | Admitting: Obstetrics & Gynecology

## 2016-06-05 VITALS — BP 122/68 | HR 88 | Wt 149.0 lb

## 2016-06-05 DIAGNOSIS — R87619 Unspecified abnormal cytological findings in specimens from cervix uteri: Secondary | ICD-10-CM

## 2016-06-05 DIAGNOSIS — J4520 Mild intermittent asthma, uncomplicated: Secondary | ICD-10-CM

## 2016-06-05 DIAGNOSIS — O99513 Diseases of the respiratory system complicating pregnancy, third trimester: Secondary | ICD-10-CM

## 2016-06-05 DIAGNOSIS — Z3490 Encounter for supervision of normal pregnancy, unspecified, unspecified trimester: Secondary | ICD-10-CM

## 2016-06-05 DIAGNOSIS — O3440 Maternal care for other abnormalities of cervix, unspecified trimester: Secondary | ICD-10-CM

## 2016-06-05 DIAGNOSIS — Z3480 Encounter for supervision of other normal pregnancy, unspecified trimester: Secondary | ICD-10-CM

## 2016-06-05 LAB — CBC
HEMATOCRIT: 34.1 % — AB (ref 35.0–45.0)
HEMOGLOBIN: 10.9 g/dL — AB (ref 11.7–15.5)
MCH: 25.2 pg — AB (ref 27.0–33.0)
MCHC: 32 g/dL (ref 32.0–36.0)
MCV: 78.8 fL — ABNORMAL LOW (ref 80.0–100.0)
MPV: 10.2 fL (ref 7.5–12.5)
Platelets: 232 10*3/uL (ref 140–400)
RBC: 4.33 MIL/uL (ref 3.80–5.10)
RDW: 15.1 % — ABNORMAL HIGH (ref 11.0–15.0)
WBC: 6.5 10*3/uL (ref 3.8–10.8)

## 2016-06-05 NOTE — Patient Instructions (Signed)
Third Trimester of Pregnancy The third trimester is from week 29 through week 40 (months 7 through 9). The third trimester is a time when the unborn baby (fetus) is growing rapidly. At the end of the ninth month, the fetus is about 20 inches in length and weighs 6-10 pounds. Body changes during your third trimester Your body goes through many changes during pregnancy. The changes vary from woman to woman. During the third trimester:  Your weight will continue to increase. You can expect to gain 25-35 pounds (11-16 kg) by the end of the pregnancy.  You may begin to get stretch marks on your hips, abdomen, and breasts.  You may urinate more often because the fetus is moving lower into your pelvis and pressing on your bladder.  You may develop or continue to have heartburn. This is caused by increased hormones that slow down muscles in the digestive tract.  You may develop or continue to have constipation because increased hormones slow digestion and cause the muscles that push waste through your intestines to relax.  You may develop hemorrhoids. These are swollen veins (varicose veins) in the rectum that can itch or be painful.  You may develop swollen, bulging veins (varicose veins) in your legs.  You may have increased body aches in the pelvis, back, or thighs. This is due to weight gain and increased hormones that are relaxing your joints.  You may have changes in your hair. These can include thickening of your hair, rapid growth, and changes in texture. Some women also have hair loss during or after pregnancy, or hair that feels dry or thin. Your hair will most likely return to normal after your baby is born.  Your breasts will continue to grow and they will continue to become tender. A yellow fluid (colostrum) may leak from your breasts. This is the first milk you are producing for your baby.  Your belly button may stick out.  You may notice more swelling in your hands, face, or  ankles.  You may have increased tingling or numbness in your hands, arms, and legs. The skin on your belly may also feel numb.  You may feel short of breath because of your expanding uterus.  You may have more problems sleeping. This can be caused by the size of your belly, increased need to urinate, and an increase in your body's metabolism.  You may notice the fetus "dropping," or moving lower in your abdomen.  You may have increased vaginal discharge.  Your cervix becomes thin and soft (effaced) near your due date. What to expect at prenatal visits You will have prenatal exams every 2 weeks until week 36. Then you will have weekly prenatal exams. During a routine prenatal visit:  You will be weighed to make sure you and the fetus are growing normally.  Your blood pressure will be taken.  Your abdomen will be measured to track your baby's growth.  The fetal heartbeat will be listened to.  Any test results from the previous visit will be discussed.  You may have a cervical check near your due date to see if you have effaced. At around 36 weeks, your health care provider will check your cervix. At the same time, your health care provider will also perform a test on the secretions of the vaginal tissue. This test is to determine if a type of bacteria, Group B streptococcus, is present. Your health care provider will explain this further. Your health care provider may ask you:    What your birth plan is.  How you are feeling.  If you are feeling the baby move.  If you have had any abnormal symptoms, such as leaking fluid, bleeding, severe headaches, or abdominal cramping.  If you are using any tobacco products, including cigarettes, chewing tobacco, and electronic cigarettes.  If you have any questions. Other tests or screenings that may be performed during your third trimester include:  Blood tests that check for low iron levels (anemia).  Fetal testing to check the health,  activity level, and growth of the fetus. Testing is done if you have certain medical conditions or if there are problems during the pregnancy.  Nonstress test (NST). This test checks the health of your baby to make sure there are no signs of problems, such as the baby not getting enough oxygen. During this test, a belt is placed around your belly. The baby is made to move, and its heart rate is monitored during movement. What is false labor? False labor is a condition in which you feel small, irregular tightenings of the muscles in the womb (contractions) that eventually go away. These are called Braxton Hicks contractions. Contractions may last for hours, days, or even weeks before true labor sets in. If contractions come at regular intervals, become more frequent, increase in intensity, or become painful, you should see your health care provider. What are the signs of labor?  Abdominal cramps.  Regular contractions that start at 10 minutes apart and become stronger and more frequent with time.  Contractions that start on the top of the uterus and spread down to the lower abdomen and back.  Increased pelvic pressure and dull back pain.  A watery or bloody mucus discharge that comes from the vagina.  Leaking of amniotic fluid. This is also known as your "water breaking." It could be a slow trickle or a gush. Let your doctor know if it has a color or strange odor. If you have any of these signs, call your health care provider right away, even if it is before your due date. Follow these instructions at home: Eating and drinking  Continue to eat regular, healthy meals.  Do not eat:  Raw meat or meat spreads.  Unpasteurized milk or cheese.  Unpasteurized juice.  Store-made salad.  Refrigerated smoked seafood.  Hot dogs or deli meat, unless they are piping hot.  More than 6 ounces of albacore tuna a week.  Shark, swordfish, king mackerel, or tile fish.  Store-made salads.  Raw  sprouts, such as mung bean or alfalfa sprouts.  Take prenatal vitamins as told by your health care provider.  Take 1000 mg of calcium daily as told by your health care provider.  If you develop constipation:  Take over-the-counter or prescription medicines.  Drink enough fluid to keep your urine clear or pale yellow.  Eat foods that are high in fiber, such as fresh fruits and vegetables, whole grains, and beans.  Limit foods that are high in fat and processed sugars, such as fried and sweet foods. Activity  Exercise only as directed by your health care provider. Healthy pregnant women should aim for 2 hours and 30 minutes of moderate exercise per week. If you experience any pain or discomfort while exercising, stop.  Avoid heavy lifting.  Do not exercise in extreme heat or humidity, or at high altitudes.  Wear low-heel, comfortable shoes.  Practice good posture.  Do not travel far distances unless it is absolutely necessary and only with the approval   of your health care provider.  Wear your seat belt at all times while in a car, on a bus, or on a plane.  Take frequent breaks and rest with your legs elevated if you have leg cramps or low back pain.  Do not use hot tubs, steam rooms, or saunas.  You may continue to have sex unless your health care provider tells you otherwise. Lifestyle  Do not use any products that contain nicotine or tobacco, such as cigarettes and e-cigarettes. If you need help quitting, ask your health care provider.  Do not drink alcohol.  Do not use any medicinal herbs or unprescribed drugs. These chemicals affect the formation and growth of the baby.  If you develop varicose veins:  Wear support pantyhose or compression stockings as told by your healthcare provider.  Elevate your feet for 15 minutes, 3-4 times a day.  Wear a supportive maternity bra to help with breast tenderness. General instructions  Take over-the-counter and prescription  medicines only as told by your health care provider. There are medicines that are either safe or unsafe to take during pregnancy.  Take warm sitz baths to soothe any pain or discomfort caused by hemorrhoids. Use hemorrhoid cream or witch hazel if your health care provider approves.  Avoid cat litter boxes and soil used by cats. These carry germs that can cause birth defects in the baby. If you have a cat, ask someone to clean the litter box for you.  To prepare for the arrival of your baby:  Take prenatal classes to understand, practice, and ask questions about the labor and delivery.  Make a trial run to the hospital.  Visit the hospital and tour the maternity area.  Arrange for maternity or paternity leave through employers.  Arrange for family and friends to take care of pets while you are in the hospital.  Purchase a rear-facing car seat and make sure you know how to install it in your car.  Pack your hospital bag.  Prepare the baby's nursery. Make sure to remove all pillows and stuffed animals from the baby's crib to prevent suffocation.  Visit your dentist if you have not gone during your pregnancy. Use a soft toothbrush to brush your teeth and be gentle when you floss.  Keep all prenatal follow-up visits as told by your health care provider. This is important. Contact a health care provider if:  You are unsure if you are in labor or if your water has broken.  You become dizzy.  You have mild pelvic cramps, pelvic pressure, or nagging pain in your abdominal area.  You have lower back pain.  You have persistent nausea, vomiting, or diarrhea.  You have an unusual or bad smelling vaginal discharge.  You have pain when you urinate. Get help right away if:  You have a fever.  You are leaking fluid from your vagina.  You have spotting or bleeding from your vagina.  You have severe abdominal pain or cramping.  You have rapid weight loss or weight gain.  You have  shortness of breath with chest pain.  You notice sudden or extreme swelling of your face, hands, ankles, feet, or legs.  Your baby makes fewer than 10 movements in 2 hours.  You have severe headaches that do not go away with medicine.  You have vision changes. Summary  The third trimester is from week 29 through week 40, months 7 through 9. The third trimester is a time when the unborn baby (fetus)   is growing rapidly.  During the third trimester, your discomfort may increase as you and your baby continue to gain weight. You may have abdominal, leg, and back pain, sleeping problems, and an increased need to urinate.  During the third trimester your breasts will keep growing and they will continue to become tender. A yellow fluid (colostrum) may leak from your breasts. This is the first milk you are producing for your baby.  False labor is a condition in which you feel small, irregular tightenings of the muscles in the womb (contractions) that eventually go away. These are called Braxton Hicks contractions. Contractions may last for hours, days, or even weeks before true labor sets in.  Signs of labor can include: abdominal cramps; regular contractions that start at 10 minutes apart and become stronger and more frequent with time; watery or bloody mucus discharge that comes from the vagina; increased pelvic pressure and dull back pain; and leaking of amniotic fluid. This information is not intended to replace advice given to you by your health care provider. Make sure you discuss any questions you have with your health care provider. Document Released: 05/30/2001 Document Revised: 11/11/2015 Document Reviewed: 08/06/2012 Elsevier Interactive Patient Education  2017 [employer]  

## 2016-06-05 NOTE — Progress Notes (Signed)
   PRENATAL VISIT NOTE  Subjective:  Eraina Wallick is a 25 y.o. G3P1011 at 27106w4d being seen today for ongoing prenatal care.  She is currently monitored for the following issues for this low-risk pregnancy and has Asthma, mild intermittent and Supervision of normal pregnancy, antepartum on her problem list.  Patient reports no complaints.  Contractions: Not present. Vag. Bleeding: None.  Movement: Present. Denies leaking of fluid.   The following portions of the patient's history were reviewed and updated as appropriate: allergies, current medications, past family history, past medical history, past social history, past surgical history and problem list. Problem list updated.  Objective:   Vitals:   06/05/16 0940  BP: 122/68  Pulse: 88  Weight: 149 lb (67.6 kg)    Fetal Status: Fetal Heart Rate (bpm): 130   Movement: Present     General:  Alert, oriented and cooperative. Patient is in no acute distress.  Skin: Skin is warm and dry. No rash noted.   Cardiovascular: Normal heart rate noted  Respiratory: Normal respiratory effort, no problems with respiration noted  Abdomen: Soft, gravid, appropriate for gestational age. Pain/Pressure: Absent     Pelvic:  Cervical exam deferred        Extremities: Normal range of motion.  Edema: None  Mental Status: Normal mood and affect. Normal behavior. Normal judgment and thought content.   Assessment and Plan:  Pregnancy: G3P1011 at 66106w4d  1. Prenatal care, antepartum  - CBC - RPR - HIV antibody (with reflex) - Glucose Tolerance, 1 HR (50g)  2. Supervision of other normal pregnancy, antepartum Pt transferred from [place] Just received PNL reviewed results with pt.  3. Mild intermittent asthma without complication stable  Preterm labor symptoms and general obstetric precautions including but not limited to vaginal bleeding, contractions, leaking of fluid and fetal movement were reviewed in detail with the patient. Please refer to After Visit  Summary for other counseling recommendations.  F/u in 2 weeks or sooner prn  Willodean Rosenthalarolyn Harraway-Smith, MD

## 2016-06-06 LAB — HIV ANTIBODY (ROUTINE TESTING W REFLEX): HIV 1&2 Ab, 4th Generation: NONREACTIVE

## 2016-06-06 LAB — RPR

## 2016-06-06 LAB — GLUCOSE TOLERANCE, 1 HOUR (50G) W/O FASTING: Glucose, 1 Hr, gestational: 70 mg/dL (ref <140)

## 2016-06-07 ENCOUNTER — Inpatient Hospital Stay (HOSPITAL_COMMUNITY)
Admission: AD | Admit: 2016-06-07 | Discharge: 2016-06-07 | Disposition: A | Discharge status: Home / Self Care | Payer: Medicaid Other | Source: Ambulatory Visit | Attending: Obstetrics & Gynecology | Admitting: Obstetrics & Gynecology

## 2016-06-07 ENCOUNTER — Encounter (HOSPITAL_COMMUNITY): Payer: Self-pay

## 2016-06-07 DIAGNOSIS — Z87891 Personal history of nicotine dependence: Secondary | ICD-10-CM | POA: Insufficient documentation

## 2016-06-07 DIAGNOSIS — Z3480 Encounter for supervision of other normal pregnancy, unspecified trimester: Secondary | ICD-10-CM

## 2016-06-07 DIAGNOSIS — Z3A28 28 weeks gestation of pregnancy: Secondary | ICD-10-CM | POA: Diagnosis not present

## 2016-06-07 DIAGNOSIS — R87619 Unspecified abnormal cytological findings in specimens from cervix uteri: Secondary | ICD-10-CM

## 2016-06-07 DIAGNOSIS — R1013 Epigastric pain: Secondary | ICD-10-CM | POA: Insufficient documentation

## 2016-06-07 DIAGNOSIS — O26893 Other specified pregnancy related conditions, third trimester: Secondary | ICD-10-CM | POA: Insufficient documentation

## 2016-06-07 DIAGNOSIS — K219 Gastro-esophageal reflux disease without esophagitis: Secondary | ICD-10-CM

## 2016-06-07 DIAGNOSIS — O99613 Diseases of the digestive system complicating pregnancy, third trimester: Secondary | ICD-10-CM

## 2016-06-07 DIAGNOSIS — R109 Unspecified abdominal pain: Secondary | ICD-10-CM | POA: Diagnosis present

## 2016-06-07 DIAGNOSIS — O3440 Maternal care for other abnormalities of cervix, unspecified trimester: Secondary | ICD-10-CM

## 2016-06-07 LAB — URINALYSIS, ROUTINE W REFLEX MICROSCOPIC
BILIRUBIN URINE: NEGATIVE
GLUCOSE, UA: NEGATIVE mg/dL
Hgb urine dipstick: NEGATIVE
KETONES UR: NEGATIVE mg/dL
NITRITE: NEGATIVE
PH: 6 (ref 5.0–8.0)
PROTEIN: 100 mg/dL — AB
Specific Gravity, Urine: 1.03 (ref 1.005–1.030)

## 2016-06-07 LAB — COMPREHENSIVE METABOLIC PANEL
ALBUMIN: 2.9 g/dL — AB (ref 3.5–5.0)
ALT: 11 U/L — ABNORMAL LOW (ref 14–54)
ANION GAP: 7 (ref 5–15)
AST: 15 U/L (ref 15–41)
Alkaline Phosphatase: 68 U/L (ref 38–126)
BUN: 9 mg/dL (ref 6–20)
CHLORIDE: 104 mmol/L (ref 101–111)
CO2: 21 mmol/L — AB (ref 22–32)
Calcium: 7.9 mg/dL — ABNORMAL LOW (ref 8.9–10.3)
Creatinine, Ser: 0.56 mg/dL (ref 0.44–1.00)
GFR calc non Af Amer: >60 mL/min (ref >60)
GLUCOSE: 112 mg/dL — AB (ref 65–99)
POTASSIUM: 3.4 mmol/L — AB (ref 3.5–5.1)
SODIUM: 132 mmol/L — AB (ref 135–145)
Total Bilirubin: 0.2 mg/dL — ABNORMAL LOW (ref 0.3–1.2)
Total Protein: 6.4 g/dL — ABNORMAL LOW (ref 6.5–8.1)

## 2016-06-07 LAB — CBC
HCT: 31.3 % — ABNORMAL LOW (ref 36.0–46.0)
Hemoglobin: 10.4 g/dL — ABNORMAL LOW (ref 12.0–15.0)
MCH: 25.3 pg — AB (ref 26.0–34.0)
MCHC: 33.2 g/dL (ref 30.0–36.0)
MCV: 76.2 fL — ABNORMAL LOW (ref 78.0–100.0)
PLATELETS: 201 10*3/uL (ref 150–400)
RBC: 4.11 MIL/uL (ref 3.87–5.11)
RDW: 14.2 % (ref 11.5–15.5)
WBC: 5.7 10*3/uL (ref 4.0–10.5)

## 2016-06-07 MED ORDER — FAMOTIDINE 20 MG PO TABS: 20.0000 mg | ORAL_TABLET | Freq: Two times a day (BID) | ORAL | 1 refills | Status: DC

## 2016-06-07 MED ORDER — GI COCKTAIL ~~LOC~~
30.0000 mL | Freq: Once | ORAL | Status: AC
  Administered 2016-06-07: 30 mL via ORAL
  Filled 2016-06-07: qty 30

## 2016-06-07 NOTE — Discharge Instructions (Signed)
Indigestion Introduction Indigestion is a feeling of pain, discomfort, burning, or fullness in the upper part of your belly (abdomen). It can come and go. It may occur often or rarely. Indigestion tends to happen while you are eating or right after you have finished eating. It may be worse at night and while bending over or lying down. Follow these instructions at home: Take these actions to lessen your pain or discomfort and to help avoid problems. Diet  Follow a diet as told by your doctor. You may need to avoid foods and drinks such as:  Coffee and tea (with or without caffeine).  Drinks that contain alcohol.  Energy drinks and sports drinks.  Carbonated drinks or sodas.  Chocolate and cocoa.  Peppermint and mint flavorings.  Garlic and onions.  Horseradish.  Spicy and acidic foods, such as peppers, chili powder, curry powder, vinegar, hot sauces, and BBQ sauce.  Citrus fruit juices and citrus fruits, such as oranges, lemons, and limes.  Tomato-based foods, such as red sauce, chili, salsa, and pizza with red sauce.  Fried and fatty foods, such as donuts, french fries, potato chips, and high-fat dressings.  High-fat meats, such as hot dogs, rib eye steak, sausage, ham, and bacon.  High-fat dairy items, such as whole milk, butter, and cream cheese.  Eat small meals often. Avoid eating large meals.  Avoid drinking large amounts of liquid with your meals.  Avoid eating meals during the 2-3 hours before bedtime.  Avoid lying down right after you eat.  Do not exercise right after you eat. General instructions  Pay attention to any changes in your symptoms.  Take over-the-counter and prescription medicines only as told by your doctor. Do not take aspirin, ibuprofen, or other NSAIDs unless your doctor says it is okay.  Do not use any tobacco products, including cigarettes, chewing tobacco, and e-cigarettes. If you need help quitting, ask your doctor.  Wear loose  clothes. Do not wear anything tight around your waist.  Raise (elevate) the head of your bed about 6 inches (15 cm).  Try to lower your stress. If you need help doing this, ask your doctor.  If you are overweight, lose an amount of weight that is healthy for you. Ask your doctor about a safe weight loss goal.  Keep all follow-up visits as told by your doctor. This is important. Contact a doctor if:  You have new symptoms.  You lose weight and you do not know why it is happening.  You have trouble swallowing, or it hurts to swallow.  Your symptoms do not get better with treatment.  Your symptoms last for more than two days.  You have a fever.  You throw up (vomit). Get help right away if:  You have pain in your arms, neck, jaw, teeth, or back.  You feel sweaty, dizzy, or light-headed.  You pass out (faint).  You have chest pain or shortness of breath.  You cannot stop throwing up, or you throw up blood.  Your poop (stool) is bloody or black.  You have very bad pain in your belly. This information is not intended to replace advice given to you by your health care provider. Make sure you discuss any questions you have with your health care provider. Document Released: 07/08/2010 Document Revised: 11/11/2015 Document Reviewed: 09/30/2014  2017 Elsevier  Heartburn During Pregnancy Heartburn is pain or discomfort in the throat or chest. It may cause a burning feeling. It happens when stomach acid moves up into  the tube that carries food from your mouth to your stomach (esophagus). Heartburn is common during pregnancy. It usually goes away or gets better after giving birth. Follow these instructions at home: Eating and drinking  Do not drink alcohol while you are pregnant.  Figure out which foods and beverages make you feel worse, and avoid them.  Beverages that you may want to avoid include:  Coffee and tea (with or without caffeine).  Energy drinks and sports  drinks.  Bubbly (carbonated) drinks or sodas.  Citrus fruit juices.  Foods that you may want to avoid include:  Chocolate and cocoa.  Peppermint and mint flavorings.  Garlic, onions, and horseradish.  Spicy and acidic foods. These include peppers, chili powder, curry powder, vinegar, hot sauces, and barbecue sauce.  Citrus fruits, such as oranges, lemons, and limes.  Tomato-based foods, such as red sauce, chili, and salsa.  Fried and fatty foods, such as donuts, french fries, potato chips, and high-fat dressings.  High-fat meats, such as hot dogs, cold cuts, sausage, ham, and bacon.  High-fat dairy items, such as whole milk, butter, and cheese.  Eat small meals often, instead of large meals.  Avoid drinking a lot of liquid with your meals.  Avoid eating meals during the 2-3 hours before you go to bed.  Avoid lying down right after you eat.  Do not exercise right after you eat. Medicines  Take over-the-counter and prescription medicines only as told by your doctor.  Do not take aspirin, ibuprofen, or other NSAIDs unless your doctor tells you to do that.  Your doctor may tell you to avoid medicines that have sodium bicarbonate in them. General instructions  If told, raise the head of your bed about 6 inches (15 cm). You can do this by putting blocks under the legs. Sleeping with more pillows does not help with heartburn.  Do not use any products that contain nicotine or tobacco, such as cigarettes and e-cigarettes. If you need help quitting, ask your doctor.  Wear loose-fitting clothing.  Try to lower your stress, such as with yoga or meditation. If you need help, ask your doctor.  Stay at a healthy weight. If you are overweight, work with your doctor to safely lose weight.  Keep all follow-up visits as told by your doctor. This is important. Contact a doctor if:  You get new symptoms.  Your symptoms do not get better with treatment.  You have weight loss  and you do not know why.  You have trouble swallowing.  You make loud sounds when you breathe (wheeze).  You have a cough that does not go away.  You have heartburn often for more than 2 weeks.  You feel sick to your stomach (nauseous), and this does not get better with treatment.  You are throwing up (vomiting), and this does not get better with treatment.  You have pain in your belly (abdomen). Get help right away if:  You have very bad chest pain that spreads to your arm, neck, or jaw.  You feel sweaty, dizzy, or light-headed.  You have trouble breathing.  You have pain when swallowing.  You throw up and your throw-up looks like blood or coffee grounds.  Your poop (stool) is bloody or black. This information is not intended to replace advice given to you by your health care provider. Make sure you discuss any questions you have with your health care provider. Document Released: 07/08/2010 Document Revised: 02/21/2016 Document Reviewed: 02/21/2016 Elsevier Interactive Patient Education  2017 [place].

## 2016-06-07 NOTE — MAU Provider Note (Signed)
  History     CSN: [id number]  Arrival date and time: 06/07/16 2021    First Provider Initiated Contact with Patient 06/07/16 2144     Chief Complaint  Patient presents with  . Abdominal Pain   Patient is a [age] y/o G3P1011 at 28+6. [facility]. Sharp pains started around 7pm. Worsening pain- constant ache with sharp waves (9/10). Came by EMS because she couldn't drive. Denies nausea, vomiting, diarrhea, constipation, belching, or flatus.  Denies vaginal bleeding, LOF. Good fetal movement. Sitting upright makes pain worse. Lying flat and on side helps relieve pain.     OB History    Gravida Para Term Preterm AB Living   3 1 1   1 1    SAB TAB Ectopic Multiple Live Births   1              Past Medical History:  Diagnosis Date  . Asthma     Past Surgical History:  Procedure Laterality Date  . NO PAST SURGERIES    . WISDOM TOOTH EXTRACTION      Family History  Problem Relation Age of Onset  . Cancer Maternal Grandmother     breast  . Diabetes Paternal Grandmother   . Anesthesia problems Neg Hx     Social History  Substance Use Topics  . Smoking status: Former Smoker    Packs/day: 0.50    Types: Cigarettes    Quit date: 04/19/2014  . Smokeless tobacco: Former NeurosurgeonUser  . Alcohol use No    Allergies:  Allergies  Allergen Reactions  . Ciprofloxacin Anaphylaxis and Other (See Comments)    Gets really hot.    Prescriptions Prior to Admission  Medication Sig Dispense Refill Last Dose  . albuterol (PROVENTIL HFA;VENTOLIN HFA) 108 (90 Base) MCG/ACT inhaler Inhale 2 puffs into the lungs every 6 (six) hours as needed for wheezing or shortness of breath. 1 Inhaler 2 Past Month at Unknown time    Review of Systems  Constitutional: Negative for chills, fever and weight loss.  Respiratory: Negative for cough.   Cardiovascular: Negative for chest pain.  Gastrointestinal: Positive for abdominal pain and heartburn. Negative for blood in stool,  constipation, diarrhea, melena, nausea and vomiting.  Genitourinary: Negative for dysuria, flank pain and hematuria.   Physical Exam   Blood pressure 124/75, pulse 107, temperature 98.2 F (36.8 C), temperature source Oral, resp. rate 18, last menstrual period 11/14/2015, unknown if currently breastfeeding.  Physical Exam  Constitutional: She is oriented to person, place, and time. She appears well-developed and well-nourished.  Respiratory: Effort normal.  GI: Soft. She exhibits no distension. There is tenderness. There is no rebound and no guarding.  Musculoskeletal: Normal range of motion.  Neurological: She is alert and oriented to person, place, and time.  Psychiatric: She has a normal mood and affect. Her behavior is normal. Judgment and thought content normal.    MAU Course  Procedures  MDM CBC, CMP wnl UA wnl  Assessment and Plan  Patient is a 25 y/o G3P1011 at 28+6 who presents with sharp epigastric pain after eating [employer]. This likely represents indigestion. Patient responded well to GI cocktail. Labs wnl. Not contracting, EFM reassuring. Will d/c home with Pepcid rx, and routine outpatient f/u.  Clearance Anne Nguyen 06/07/2016, 9:40 PM   I was present for the exam and agree with above. FHR reactive.   Pueblito[place]ia Van Nguyen, CNM 06/08/2016 1:06 AM

## 2016-06-07 NOTE — MAU Note (Signed)
Pt presents stating she was eating dinner at work and after she finished, she started having upper abdominal pain that is getting worse. States it is coming and going and is sharp. Denies bleeding or leaking. Reports good fetal movement.

## 2016-06-08 ENCOUNTER — Other Ambulatory Visit: Payer: Self-pay | Admitting: Advanced Practice Midwife

## 2016-06-08 DIAGNOSIS — O99613 Diseases of the digestive system complicating pregnancy, third trimester: Principal | ICD-10-CM

## 2016-06-08 DIAGNOSIS — K219 Gastro-esophageal reflux disease without esophagitis: Secondary | ICD-10-CM

## 2016-06-08 MED ORDER — FAMOTIDINE 20 MG PO TABS: 20.0000 mg | ORAL_TABLET | Freq: Two times a day (BID) | ORAL | 3 refills | Status: AC

## 2016-06-08 NOTE — Progress Notes (Signed)
E-Rx Pepcid.

## 2016-06-19 NOTE — L&D Delivery Note (Signed)
Delivery Note At 11:11 AM a viable female was delivered via spontaneous  vaginal (Presentation: vertex; LOA, right hand presenting in front of the face).  APGAR: 7, 8; weight pending .   Placenta status:intact, spontaneously delivered .  Cord: 3 vessels, clamped after 60s with no complications: .  Cord pH: n/a Sterile gloves worn the entire time AROM at 1106 thick meconium, NICU team called to be present to delivery Anesthesia:  none Episiotomy:  none Lacerations:  1st degree periurethral bilaterally, perineal posterior Suture Repair: none needed Est. Blood Loss (mL): 50cc  Mom to postpartum.  Baby to Nursery for observation. Anne Nguyen.  Anne Carvalho do Silvio ClaymanAmaral MD PGY1 08/25/2016, 12:06 PM  I was present at this delivery and I agree with the above documentation and findings.  Anne Nguyen

## 2016-06-26 ENCOUNTER — Ambulatory Visit (INDEPENDENT_AMBULATORY_CARE_PROVIDER_SITE_OTHER): Payer: Medicaid Other | Admitting: Obstetrics & Gynecology

## 2016-06-26 VITALS — BP 115/63 | HR 93 | Wt 151.0 lb

## 2016-06-26 DIAGNOSIS — Z3480 Encounter for supervision of other normal pregnancy, unspecified trimester: Secondary | ICD-10-CM

## 2016-06-26 DIAGNOSIS — R87619 Unspecified abnormal cytological findings in specimens from cervix uteri: Secondary | ICD-10-CM

## 2016-06-26 DIAGNOSIS — J4520 Mild intermittent asthma, uncomplicated: Secondary | ICD-10-CM

## 2016-06-26 DIAGNOSIS — O3443 Maternal care for other abnormalities of cervix, third trimester: Secondary | ICD-10-CM

## 2016-06-26 DIAGNOSIS — O3440 Maternal care for other abnormalities of cervix, unspecified trimester: Secondary | ICD-10-CM

## 2016-06-26 DIAGNOSIS — O99513 Diseases of the respiratory system complicating pregnancy, third trimester: Secondary | ICD-10-CM

## 2016-06-26 NOTE — Progress Notes (Signed)
   PRENATAL VISIT NOTE  Subjective:  Anne Nguyen is a 26 y.o. G3P1011 at 3411w4d being seen today for ongoing prenatal care.  She is currently monitored for the following issues for this high-risk pregnancy and has Asthma, mild intermittent; Supervision of normal pregnancy, antepartum; and Abnormal cervical Papanicolaou smear affecting pregnancy, antepartum on her problem list.  Patient reports no complaints.  Contractions: Not present. Vag. Bleeding: None.  Movement: Present. Denies leaking of fluid.   The following portions of the patient's history were reviewed and updated as appropriate: allergies, current medications, past family history, past medical history, past social history, past surgical history and problem list. Problem list updated.  Objective:   Vitals:   06/26/16 1027  BP: 115/63  Pulse: 93  Weight: 151 lb (68.5 kg)    Fetal Status: Fetal Heart Rate (bpm): 130   Movement: Present     General:  Alert, oriented and cooperative. Patient is in no acute distress.  Skin: Skin is warm and dry. No rash noted.   Cardiovascular: Normal heart rate noted  Respiratory: Normal respiratory effort, no problems with respiration noted  Abdomen: Soft, gravid, appropriate for gestational age. Pain/Pressure: Absent     Pelvic:  Cervical exam deferred        Extremities: Normal range of motion.  Edema: None  Mental Status: Normal mood and affect. Normal behavior. Normal judgment and thought content.   Assessment and Plan:  Pregnancy: G3P1011 at 6611w4d  1. Supervision of other normal pregnancy, antepartum Requests sterilization.  [place]  Papers resigned. Issues of regret reviewed with pt  2. Mild intermittent asthma without complication Has not used inhaler for > 2months   3. Abnormal cervical Papanicolaou smear affecting pregnancy, antepartum needs colo PP  Preterm labor symptoms and general obstetric precautions including but not limited  to vaginal bleeding, contractions, leaking of fluid and fetal movement were reviewed in detail with the patient. Please refer to After Visit Summary for other counseling recommendations.  Return in about 2 weeks (around 07/10/2016).   Total face-to-face time with patient was 20 min.  Greater than 50% was spent in counseling and coordination of care with the patient. We discussed sterilization; options of contraception.      Willodean Rosenthalarolyn Harraway-Smith, MD

## 2016-06-26 NOTE — Patient Instructions (Addendum)
Third Trimester of Pregnancy The third trimester is from week 29 through week 40 (months 7 through 9). The third trimester is a time when the unborn baby (fetus) is growing rapidly. At the end of the ninth month, the fetus is about 20 inches in length and weighs 6-10 pounds. Body changes during your third trimester Your body goes through many changes during pregnancy. The changes vary from woman to woman. During the third trimester:  Your weight will continue to increase. You can expect to gain 25-35 pounds (11-16 kg) by the end of the pregnancy.  You may begin to get stretch marks on your hips, abdomen, and breasts.  You may urinate more often because the fetus is moving lower into your pelvis and pressing on your bladder.  You may develop or continue to have heartburn. This is caused by increased hormones that slow down muscles in the digestive tract.  You may develop or continue to have constipation because increased hormones slow digestion and cause the muscles that push waste through your intestines to relax.  You may develop hemorrhoids. These are swollen veins (varicose veins) in the rectum that can itch or be painful.  You may develop swollen, bulging veins (varicose veins) in your legs.  You may have increased body aches in the pelvis, back, or thighs. This is due to weight gain and increased hormones that are relaxing your joints.  You may have changes in your hair. These can include thickening of your hair, rapid growth, and changes in texture. Some women also have hair loss during or after pregnancy, or hair that feels dry or thin. Your hair will most likely return to normal after your baby is born.  Your breasts will continue to grow and they will continue to become tender. A yellow fluid (colostrum) may leak from your breasts. This is the first milk you are producing for your baby.  Your belly button may stick out.  You may notice more swelling in your hands, face, or  ankles.  You may have increased tingling or numbness in your hands, arms, and legs. The skin on your belly may also feel numb.  You may feel short of breath because of your expanding uterus.  You may have more problems sleeping. This can be caused by the size of your belly, increased need to urinate, and an increase in your body's metabolism.  You may notice the fetus "dropping," or moving lower in your abdomen.  You may have increased vaginal discharge.  Your cervix becomes thin and soft (effaced) near your due date. What to expect at prenatal visits You will have prenatal exams every 2 weeks until week 36. Then you will have weekly prenatal exams. During a routine prenatal visit:  You will be weighed to make sure you and the fetus are growing normally.  Your blood pressure will be taken.  Your abdomen will be measured to track your baby's growth.  The fetal heartbeat will be listened to.  Any test results from the previous visit will be discussed.  You may have a cervical check near your due date to see if you have effaced. At around 36 weeks, your health care provider will check your cervix. At the same time, your health care provider will also perform a test on the secretions of the vaginal tissue. This test is to determine if a type of bacteria, Group B streptococcus, is present. Your health care provider will explain this further. Your health care provider may ask you:    What your birth plan is.  How you are feeling.  If you are feeling the baby move.  If you have had any abnormal symptoms, such as leaking fluid, bleeding, severe headaches, or abdominal cramping.  If you are using any tobacco products, including cigarettes, chewing tobacco, and electronic cigarettes.  If you have any questions. Other tests or screenings that may be performed during your third trimester include:  Blood tests that check for low iron levels (anemia).  Fetal testing to check the health,  activity level, and growth of the fetus. Testing is done if you have certain medical conditions or if there are problems during the pregnancy.  Nonstress test (NST). This test checks the health of your baby to make sure there are no signs of problems, such as the baby not getting enough oxygen. During this test, a belt is placed around your belly. The baby is made to move, and its heart rate is monitored during movement. What is false labor? False labor is a condition in which you feel small, irregular tightenings of the muscles in the womb (contractions) that eventually go away. These are called Braxton Hicks contractions. Contractions may last for hours, days, or even weeks before true labor sets in. If contractions come at regular intervals, become more frequent, increase in intensity, or become painful, you should see your health care provider. What are the signs of labor?  Abdominal cramps.  Regular contractions that start at 10 minutes apart and become stronger and more frequent with time.  Contractions that start on the top of the uterus and spread down to the lower abdomen and back.  Increased pelvic pressure and dull back pain.  A watery or bloody mucus discharge that comes from the vagina.  Leaking of amniotic fluid. This is also known as your "water breaking." It could be a slow trickle or a gush. Let your doctor know if it has a color or strange odor. If you have any of these signs, call your health care provider right away, even if it is before your due date. Follow these instructions at home: Eating and drinking  Continue to eat regular, healthy meals.  Do not eat:  Raw meat or meat spreads.  Unpasteurized milk or cheese.  Unpasteurized juice.  Store-made salad.  Refrigerated smoked seafood.  Hot dogs or deli meat, unless they are piping hot.  More than 6 ounces of albacore tuna a week.  Shark, swordfish, king mackerel, or tile fish.  Store-made salads.  Raw  sprouts, such as mung bean or alfalfa sprouts.  Take prenatal vitamins as told by your health care provider.  Take 1000 mg of calcium daily as told by your health care provider.  If you develop constipation:  Take over-the-counter or prescription medicines.  Drink enough fluid to keep your urine clear or pale yellow.  Eat foods that are high in fiber, such as fresh fruits and vegetables, whole grains, and beans.  Limit foods that are high in fat and processed sugars, such as fried and sweet foods. Activity  Exercise only as directed by your health care provider. Healthy pregnant women should aim for 2 hours and 30 minutes of moderate exercise per week. If you experience any pain or discomfort while exercising, stop.  Avoid heavy lifting.  Do not exercise in extreme heat or humidity, or at high altitudes.  Wear low-heel, comfortable shoes.  Practice good posture.  Do not travel far distances unless it is absolutely necessary and only with the approval   of your health care provider.  Wear your seat belt at all times while in a car, on a bus, or on a plane.  Take frequent breaks and rest with your legs elevated if you have leg cramps or low back pain.  Do not use hot tubs, steam rooms, or saunas.  You may continue to have sex unless your health care provider tells you otherwise. Lifestyle  Do not use any products that contain nicotine or tobacco, such as cigarettes and e-cigarettes. If you need help quitting, ask your health care provider.  Do not drink alcohol.  Do not use any medicinal herbs or unprescribed drugs. These chemicals affect the formation and growth of the baby.  If you develop varicose veins:  Wear support pantyhose or compression stockings as told by your healthcare provider.  Elevate your feet for 15 minutes, 3-4 times a day.  Wear a supportive maternity bra to help with breast tenderness. General instructions  Take over-the-counter and prescription  medicines only as told by your health care provider. There are medicines that are either safe or unsafe to take during pregnancy.  Take warm sitz baths to soothe any pain or discomfort caused by hemorrhoids. Use hemorrhoid cream or witch hazel if your health care provider approves.  Avoid cat litter boxes and soil used by cats. These carry germs that can cause birth defects in the baby. If you have a cat, ask someone to clean the litter box for you.  To prepare for the arrival of your baby:  Take prenatal classes to understand, practice, and ask questions about the labor and delivery.  Make a trial run to the hospital.  Visit the hospital and tour the maternity area.  Arrange for maternity or paternity leave through employers.  Arrange for family and friends to take care of pets while you are in the hospital.  Purchase a rear-facing car seat and make sure you know how to install it in your car.  Pack your hospital bag.  Prepare the baby's nursery. Make sure to remove all pillows and stuffed animals from the baby's crib to prevent suffocation.  Visit your dentist if you have not gone during your pregnancy. Use a soft toothbrush to brush your teeth and be gentle when you floss.  Keep all prenatal follow-up visits as told by your health care provider. This is important. Contact a health care provider if:  You are unsure if you are in labor or if your water has broken.  You become dizzy.  You have mild pelvic cramps, pelvic pressure, or nagging pain in your abdominal area.  You have lower back pain.  You have persistent nausea, vomiting, or diarrhea.  You have an unusual or bad smelling vaginal discharge.  You have pain when you urinate. Get help right away if:  You have a fever.  You are leaking fluid from your vagina.  You have spotting or bleeding from your vagina.  You have severe abdominal pain or cramping.  You have rapid weight loss or weight gain.  You have  shortness of breath with chest pain.  You notice sudden or extreme swelling of your face, hands, ankles, feet, or legs.  Your baby makes fewer than 10 movements in 2 hours.  You have severe headaches that do not go away with medicine.  You have vision changes. Summary  The third trimester is from week 29 through week 40, months 7 through 9. The third trimester is a time when the unborn baby (fetus)   is growing rapidly.  During the third trimester, your discomfort may increase as you and your baby continue to gain weight. You may have abdominal, leg, and back pain, sleeping problems, and an increased need to urinate.  During the third trimester your breasts will keep growing and they will continue to become tender. A yellow fluid (colostrum) may leak from your breasts. This is the first milk you are producing for your baby.  False labor is a condition in which you feel small, irregular tightenings of the muscles in the womb (contractions) that eventually go away. These are called Braxton Hicks contractions. Contractions may last for hours, days, or even weeks before true labor sets in.  Signs of labor can include: abdominal cramps; regular contractions that start at 10 minutes apart and become stronger and more frequent with time; watery or bloody mucus discharge that comes from the vagina; increased pelvic pressure and dull back pain; and leaking of amniotic fluid. This information is not intended to replace advice given to you by your health care provider. Make sure you discuss any questions you have with your health care provider. Document Released: 05/30/2001 Document Revised: 11/11/2015 Document Reviewed: 08/06/2012 Elsevier Interactive Patient Education  2017 [employer]   [facility]- the meds for constipation.  1-2 capsful per day in fluid

## 2016-07-03 ENCOUNTER — Telehealth: Payer: Self-pay

## 2016-07-03 MED ORDER — FLUCONAZOLE 150 MG PO TABS: 150.0000 mg | ORAL_TABLET | Freq: Once | ORAL | 0 refills | Status: AC

## 2016-07-03 NOTE — Telephone Encounter (Signed)
-----   Message from Pennie BanterMarni W Smith sent at 07/03/2016  9:10 AM EST ----- Regarding: Rx request [facility] [address] [address] [place]  Patient is requesting something to be called in for a yeast infection

## 2016-07-10 ENCOUNTER — Encounter: Payer: Medicaid Other | Admitting: Family Medicine

## 2016-07-13 ENCOUNTER — Ambulatory Visit (INDEPENDENT_AMBULATORY_CARE_PROVIDER_SITE_OTHER): Payer: Medicaid Other | Admitting: Obstetrics & Gynecology

## 2016-07-13 VITALS — BP 117/65 | HR 102 | Wt 152.0 lb

## 2016-07-13 DIAGNOSIS — O3440 Maternal care for other abnormalities of cervix, unspecified trimester: Secondary | ICD-10-CM

## 2016-07-13 DIAGNOSIS — J4520 Mild intermittent asthma, uncomplicated: Secondary | ICD-10-CM

## 2016-07-13 DIAGNOSIS — O3443 Maternal care for other abnormalities of cervix, third trimester: Secondary | ICD-10-CM

## 2016-07-13 DIAGNOSIS — R87619 Unspecified abnormal cytological findings in specimens from cervix uteri: Secondary | ICD-10-CM

## 2016-07-13 DIAGNOSIS — O99513 Diseases of the respiratory system complicating pregnancy, third trimester: Secondary | ICD-10-CM

## 2016-07-13 DIAGNOSIS — Z3480 Encounter for supervision of other normal pregnancy, unspecified trimester: Secondary | ICD-10-CM

## 2016-07-13 NOTE — Progress Notes (Signed)
   PRENATAL VISIT NOTE  Subjective:  Anne Nguyen is a 26 y.o. G3P1011 at 5565w0d being seen today for ongoing prenatal care.  She is currently monitored for the following issues for this low-risk pregnancy and has Asthma, mild intermittent; Supervision of normal pregnancy, antepartum; and Abnormal cervical Papanicolaou smear affecting pregnancy, antepartum on her problem list.  Patient reports occ BH ctx.  Contractions: Irritability. Vag. Bleeding: None.  Movement: Present. Denies leaking of fluid.   The following portions of the patient's history were reviewed and updated as appropriate: allergies, current medications, past family history, past medical history, past social history, past surgical history and problem list. Problem list updated.  Objective:   Vitals:   07/13/16 1006  BP: 117/65  Pulse: (!) 102  Weight: 152 lb (68.9 kg)    Fetal Status: Fetal Heart Rate (bpm): 134   Movement: Present     General:  Alert, oriented and cooperative. Patient is in no acute distress.  Skin: Skin is warm and dry. No rash noted.   Cardiovascular: Normal heart rate noted  Respiratory: Normal respiratory effort, no problems with respiration noted  Abdomen: Soft, gravid, appropriate for gestational age. Pain/Pressure: Present     Pelvic:  Cervical exam performed        Extremities: Normal range of motion.  Edema: None  Mental Status: Normal mood and affect. Normal behavior. Normal judgment and thought content.   Assessment and Plan:  Pregnancy: G3P1011 at 9565w0d  1. Supervision of other normal pregnancy, antepartum Pt with intermittent BH ctx 2. Mild intermittent asthma without complication  3. Abnormal cervical Papanicolaou smear affecting pregnancy, antepartum  Preterm labor symptoms and general obstetric precautions including but not limited to vaginal bleeding, contractions, leaking of fluid and fetal movement were reviewed in detail with the patient. Please refer to After Visit Summary  for other counseling recommendations.  Return in about 2 weeks (around 07/27/2016).   Willodean Rosenthalarolyn Harraway-Smith, MD

## 2016-07-13 NOTE — Patient Instructions (Signed)
Third Trimester of Pregnancy The third trimester is from week 29 through week 40 (months 7 through 9). The third trimester is a time when the unborn baby (fetus) is growing rapidly. At the end of the ninth month, the fetus is about 20 inches in length and weighs 6-10 pounds. Body changes during your third trimester Your body goes through many changes during pregnancy. The changes vary from woman to woman. During the third trimester:  Your weight will continue to increase. You can expect to gain 25-35 pounds (11-16 kg) by the end of the pregnancy.  You may begin to get stretch marks on your hips, abdomen, and breasts.  You may urinate more often because the fetus is moving lower into your pelvis and pressing on your bladder.  You may develop or continue to have heartburn. This is caused by increased hormones that slow down muscles in the digestive tract.  You may develop or continue to have constipation because increased hormones slow digestion and cause the muscles that push waste through your intestines to relax.  You may develop hemorrhoids. These are swollen veins (varicose veins) in the rectum that can itch or be painful.  You may develop swollen, bulging veins (varicose veins) in your legs.  You may have increased body aches in the pelvis, back, or thighs. This is due to weight gain and increased hormones that are relaxing your joints.  You may have changes in your hair. These can include thickening of your hair, rapid growth, and changes in texture. Some women also have hair loss during or after pregnancy, or hair that feels dry or thin. Your hair will most likely return to normal after your baby is born.  Your breasts will continue to grow and they will continue to become tender. A yellow fluid (colostrum) may leak from your breasts. This is the first milk you are producing for your baby.  Your belly button may stick out.  You may notice more swelling in your hands, face, or  ankles.  You may have increased tingling or numbness in your hands, arms, and legs. The skin on your belly may also feel numb.  You may feel short of breath because of your expanding uterus.  You may have more problems sleeping. This can be caused by the size of your belly, increased need to urinate, and an increase in your body's metabolism.  You may notice the fetus "dropping," or moving lower in your abdomen.  You may have increased vaginal discharge.  Your cervix becomes thin and soft (effaced) near your due date. What to expect at prenatal visits You will have prenatal exams every 2 weeks until week 36. Then you will have weekly prenatal exams. During a routine prenatal visit:  You will be weighed to make sure you and the fetus are growing normally.  Your blood pressure will be taken.  Your abdomen will be measured to track your baby's growth.  The fetal heartbeat will be listened to.  Any test results from the previous visit will be discussed.  You may have a cervical check near your due date to see if you have effaced. At around 36 weeks, your health care provider will check your cervix. At the same time, your health care provider will also perform a test on the secretions of the vaginal tissue. This test is to determine if a type of bacteria, Group B streptococcus, is present. Your health care provider will explain this further. Your health care provider may ask you:    What your birth plan is.  How you are feeling.  If you are feeling the baby move.  If you have had any abnormal symptoms, such as leaking fluid, bleeding, severe headaches, or abdominal cramping.  If you are using any tobacco products, including cigarettes, chewing tobacco, and electronic cigarettes.  If you have any questions. Other tests or screenings that may be performed during your third trimester include:  Blood tests that check for low iron levels (anemia).  Fetal testing to check the health,  activity level, and growth of the fetus. Testing is done if you have certain medical conditions or if there are problems during the pregnancy.  Nonstress test (NST). This test checks the health of your baby to make sure there are no signs of problems, such as the baby not getting enough oxygen. During this test, a belt is placed around your belly. The baby is made to move, and its heart rate is monitored during movement. What is false labor? False labor is a condition in which you feel small, irregular tightenings of the muscles in the womb (contractions) that eventually go away. These are called Braxton Hicks contractions. Contractions may last for hours, days, or even weeks before true labor sets in. If contractions come at regular intervals, become more frequent, increase in intensity, or become painful, you should see your health care provider. What are the signs of labor?  Abdominal cramps.  Regular contractions that start at 10 minutes apart and become stronger and more frequent with time.  Contractions that start on the top of the uterus and spread down to the lower abdomen and back.  Increased pelvic pressure and dull back pain.  A watery or bloody mucus discharge that comes from the vagina.  Leaking of amniotic fluid. This is also known as your "water breaking." It could be a slow trickle or a gush. Let your doctor know if it has a color or strange odor. If you have any of these signs, call your health care provider right away, even if it is before your due date. Follow these instructions at home: Eating and drinking  Continue to eat regular, healthy meals.  Do not eat:  Raw meat or meat spreads.  Unpasteurized milk or cheese.  Unpasteurized juice.  Store-made salad.  Refrigerated smoked seafood.  Hot dogs or deli meat, unless they are piping hot.  More than 6 ounces of albacore tuna a week.  Shark, swordfish, king mackerel, or tile fish.  Store-made salads.  Raw  sprouts, such as mung bean or alfalfa sprouts.  Take prenatal vitamins as told by your health care provider.  Take 1000 mg of calcium daily as told by your health care provider.  If you develop constipation:  Take over-the-counter or prescription medicines.  Drink enough fluid to keep your urine clear or pale yellow.  Eat foods that are high in fiber, such as fresh fruits and vegetables, whole grains, and beans.  Limit foods that are high in fat and processed sugars, such as fried and sweet foods. Activity  Exercise only as directed by your health care provider. Healthy pregnant women should aim for 2 hours and 30 minutes of moderate exercise per week. If you experience any pain or discomfort while exercising, stop.  Avoid heavy lifting.  Do not exercise in extreme heat or humidity, or at high altitudes.  Wear low-heel, comfortable shoes.  Practice good posture.  Do not travel far distances unless it is absolutely necessary and only with the approval   of your health care provider.  Wear your seat belt at all times while in a car, on a bus, or on a plane.  Take frequent breaks and rest with your legs elevated if you have leg cramps or low back pain.  Do not use hot tubs, steam rooms, or saunas.  You may continue to have sex unless your health care provider tells you otherwise. Lifestyle  Do not use any products that contain nicotine or tobacco, such as cigarettes and e-cigarettes. If you need help quitting, ask your health care provider.  Do not drink alcohol.  Do not use any medicinal herbs or unprescribed drugs. These chemicals affect the formation and growth of the baby.  If you develop varicose veins:  Wear support pantyhose or compression stockings as told by your healthcare provider.  Elevate your feet for 15 minutes, 3-4 times a day.  Wear a supportive maternity bra to help with breast tenderness. General instructions  Take over-the-counter and prescription  medicines only as told by your health care provider. There are medicines that are either safe or unsafe to take during pregnancy.  Take warm sitz baths to soothe any pain or discomfort caused by hemorrhoids. Use hemorrhoid cream or witch hazel if your health care provider approves.  Avoid cat litter boxes and soil used by cats. These carry germs that can cause birth defects in the baby. If you have a cat, ask someone to clean the litter box for you.  To prepare for the arrival of your baby:  Take prenatal classes to understand, practice, and ask questions about the labor and delivery.  Make a trial run to the hospital.  Visit the hospital and tour the maternity area.  Arrange for maternity or paternity leave through employers.  Arrange for family and friends to take care of pets while you are in the hospital.  Purchase a rear-facing car seat and make sure you know how to install it in your car.  Pack your hospital bag.  Prepare the baby's nursery. Make sure to remove all pillows and stuffed animals from the baby's crib to prevent suffocation.  Visit your dentist if you have not gone during your pregnancy. Use a soft toothbrush to brush your teeth and be gentle when you floss.  Keep all prenatal follow-up visits as told by your health care provider. This is important. Contact a health care provider if:  You are unsure if you are in labor or if your water has broken.  You become dizzy.  You have mild pelvic cramps, pelvic pressure, or nagging pain in your abdominal area.  You have lower back pain.  You have persistent nausea, vomiting, or diarrhea.  You have an unusual or bad smelling vaginal discharge.  You have pain when you urinate. Get help right away if:  You have a fever.  You are leaking fluid from your vagina.  You have spotting or bleeding from your vagina.  You have severe abdominal pain or cramping.  You have rapid weight loss or weight gain.  You have  shortness of breath with chest pain.  You notice sudden or extreme swelling of your face, hands, ankles, feet, or legs.  Your baby makes fewer than 10 movements in 2 hours.  You have severe headaches that do not go away with medicine.  You have vision changes. Summary  The third trimester is from week 29 through week 40, months 7 through 9. The third trimester is a time when the unborn baby (fetus)   is growing rapidly.  During the third trimester, your discomfort may increase as you and your baby continue to gain weight. You may have abdominal, leg, and back pain, sleeping problems, and an increased need to urinate.  During the third trimester your breasts will keep growing and they will continue to become tender. A yellow fluid (colostrum) may leak from your breasts. This is the first milk you are producing for your baby.  False labor is a condition in which you feel small, irregular tightenings of the muscles in the womb (contractions) that eventually go away. These are called Braxton Hicks contractions. Contractions may last for hours, days, or even weeks before true labor sets in.  Signs of labor can include: abdominal cramps; regular contractions that start at 10 minutes apart and become stronger and more frequent with time; watery or bloody mucus discharge that comes from the vagina; increased pelvic pressure and dull back pain; and leaking of amniotic fluid. This information is not intended to replace advice given to you by your health care provider. Make sure you discuss any questions you have with your health care provider. Document Released: 05/30/2001 Document Revised: 11/11/2015 Document Reviewed: 08/06/2012 Elsevier Interactive Patient Education  2017 [employer]  

## 2016-07-18 ENCOUNTER — Encounter (HOSPITAL_COMMUNITY): Payer: Self-pay | Admitting: *Deleted

## 2016-07-18 ENCOUNTER — Inpatient Hospital Stay (HOSPITAL_COMMUNITY)
Admission: AD | Admit: 2016-07-18 | Discharge: 2016-07-18 | Disposition: A | Discharge status: Home / Self Care | Payer: Medicaid Other | Source: Ambulatory Visit | Attending: Obstetrics and Gynecology | Admitting: Obstetrics and Gynecology

## 2016-07-18 DIAGNOSIS — R109 Unspecified abdominal pain: Secondary | ICD-10-CM | POA: Diagnosis present

## 2016-07-18 DIAGNOSIS — O3440 Maternal care for other abnormalities of cervix, unspecified trimester: Secondary | ICD-10-CM

## 2016-07-18 DIAGNOSIS — Z87891 Personal history of nicotine dependence: Secondary | ICD-10-CM | POA: Diagnosis not present

## 2016-07-18 DIAGNOSIS — R87619 Unspecified abnormal cytological findings in specimens from cervix uteri: Secondary | ICD-10-CM | POA: Diagnosis not present

## 2016-07-18 DIAGNOSIS — O2343 Unspecified infection of urinary tract in pregnancy, third trimester: Secondary | ICD-10-CM | POA: Insufficient documentation

## 2016-07-18 DIAGNOSIS — O4703 False labor before 37 completed weeks of gestation, third trimester: Secondary | ICD-10-CM | POA: Diagnosis not present

## 2016-07-18 DIAGNOSIS — Z8759 Personal history of other complications of pregnancy, childbirth and the puerperium: Secondary | ICD-10-CM

## 2016-07-18 DIAGNOSIS — Z3A34 34 weeks gestation of pregnancy: Secondary | ICD-10-CM | POA: Diagnosis not present

## 2016-07-18 DIAGNOSIS — O479 False labor, unspecified: Secondary | ICD-10-CM

## 2016-07-18 DIAGNOSIS — Z3480 Encounter for supervision of other normal pregnancy, unspecified trimester: Secondary | ICD-10-CM

## 2016-07-18 LAB — URINALYSIS, ROUTINE W REFLEX MICROSCOPIC
Bilirubin Urine: NEGATIVE
Glucose, UA: NEGATIVE mg/dL
Hgb urine dipstick: NEGATIVE
KETONES UR: NEGATIVE mg/dL
Nitrite: NEGATIVE
Protein, ur: 30 mg/dL — AB
Specific Gravity, Urine: 1.023 (ref 1.005–1.030)
pH: 7 (ref 5.0–8.0)

## 2016-07-18 MED ORDER — NITROFURANTOIN MONOHYD MACRO 100 MG PO CAPS: 100.0000 mg | ORAL_CAPSULE | Freq: Two times a day (BID) | ORAL | 0 refills | Status: DC

## 2016-07-18 NOTE — Progress Notes (Addendum)
Ok to d/c efm per Estanislado SpireE. Lawrence NP. THis note is on wrong pt. EFM continues.

## 2016-07-18 NOTE — Discharge Instructions (Signed)
Pregnancy and Urinary Tract Infection WHAT IS A URINARY TRACT INFECTION? A urinary tract infection (UTI) is an infection of any part of the urinary tract. This includes the kidneys, the tubes that connect your kidneys to your bladder (ureters), the bladder, and the tube that carries urine out of your body (urethra). These organs make, store, and get rid of urine in the body. A UTI can be a bladder infection (cystitis) or a kidney infection (pyelonephritis). This infection may be caused by fungi, viruses, and bacteria. Bacteria are the most common cause of UTIs. You are more likely to develop a UTI during pregnancy because:  The physical and hormonal changes your body goes through can make it easier for bacteria to get into your urinary tract.  Your growing baby puts pressure on your uterus and can affect urine flow. DOES A UTI PLACE MY BABY AT RISK? An untreated UTI during pregnancy could lead to a kidney infection, which can cause health problems that could affect your baby. Possible complications of an untreated UTI include:  Having your baby before 37 weeks of pregnancy (premature).  Having a baby with a low birth weight.  Developing high blood pressure during pregnancy (preeclampsia). WHAT ARE THE SYMPTOMS OF A UTI? Symptoms of a UTI include:  Fever.  Frequent urination or passing small amounts of urine frequently.  Needing to urinate urgently.  Pain or a burning sensation with urination.  Urine that smells bad or unusual.  Cloudy urine.  Pain in the lower abdomen or back.  Trouble urinating.  Blood in the urine.  Vomiting or being less hungry than normal.  Diarrhea or abdominal pain.  Vaginal discharge. WHAT ARE THE TREATMENT OPTIONS FOR A UTI DURING PREGNANCY? Treatment for this condition may include:  Antibiotic medicines that are safe to take during pregnancy.  Other medicines to treat less common causes of UTI. HOW CAN I PREVENT A UTI? To prevent a UTI:  Go  to the bathroom as soon as you feel the need.  Always wipe from front to back.  Wash your genital area with soap and warm water daily.  Empty your bladder before and after sex.  Wear cotton underwear.  Limit your intake of high sugar foods or drinks, such as regular soda, juice, and sweets..  Drink 6-8 glasses of water daily.  Do not wear tight-fitting pants.  Do not douche or use deodorant sprays.  Do not drink alcohol, caffeine, or carbonated drinks. These can irritate the bladder. WHEN SHOULD I SEEK MEDICAL CARE? Seek medical care if:  Your symptoms do not improve or get worse.  You have a fever after two days of treatment.  You have a rash.  You have abnormal vaginal discharge.  You have back or side pain.  You have chills.  You have nausea and vomiting. WHEN SHOULD I SEEK IMMEDIATE MEDICAL CARE? Seek immediate medical care if you are pregnant and:  You feel contractions in your uterus.  You have lower belly pain.  You have a gush of fluid from your vagina.  You have blood in your urine.  You are vomiting and cannot keep down any medicines or water. This information is not intended to replace advice given to you by your health care provider. Make sure you discuss any questions you have with your health care provider. Document Released: 09/30/2010 Document Revised: 11/08/2015 Document Reviewed: 04/26/2015 Elsevier Interactive Patient Education  2017 Elsevier Inc.   [employer] of the uterus can occur throughout pregnancy.  Contractions are not always a sign that you are in labor.  WHAT ARE BRAXTON HICKS CONTRACTIONS?  Contractions that occur before labor are called Braxton Hicks contractions, or false labor. Toward the end of pregnancy (32-34 weeks), these contractions can develop more often and may become more forceful. This is not true labor because these contractions do not result in opening (dilatation) and thinning of the  cervix. They are sometimes difficult to tell apart from true labor because these contractions can be forceful and people have different pain tolerances. You should not feel embarrassed if you go to the hospital with false labor. Sometimes, the only way to tell if you are in true labor is for your health care provider to look for changes in the cervix. If there are no prenatal problems or other health problems associated with the pregnancy, it is completely safe to be sent home with false labor and await the onset of true labor. HOW CAN YOU TELL THE DIFFERENCE BETWEEN TRUE AND FALSE LABOR? False Labor   The contractions of false labor are usually shorter and not as hard as those of true labor.   The contractions are usually irregular.   The contractions are often felt in the front of the lower abdomen and in the groin.   The contractions may go away when you walk around or change positions while lying down.   The contractions get weaker and are shorter lasting as time goes on.   The contractions do not usually become progressively stronger, regular, and closer together as with true labor.  True Labor   Contractions in true labor last 30-70 seconds, become very regular, usually become more intense, and increase in frequency.   The contractions do not go away with walking.   The discomfort is usually felt in the top of the uterus and spreads to the lower abdomen and low back.   True labor can be determined by your health care provider with an exam. This will show that the cervix is dilating and getting thinner.  WHAT TO REMEMBER  Keep up with your usual exercises and follow other instructions given by your health care provider.   Take medicines as directed by your health care provider.   Keep your regular prenatal appointments.   Eat and drink lightly if you think you are going into labor.   If Braxton Hicks contractions are making you uncomfortable:   Change your  position from lying down or resting to walking, or from walking to resting.   Sit and rest in a tub of warm water.   Drink 2-3 glasses of water. Dehydration may cause these contractions.   Do slow and deep breathing several times an hour.  WHEN SHOULD I SEEK IMMEDIATE MEDICAL CARE? Seek immediate medical care if:  Your contractions become stronger, more regular, and closer together.   You have fluid leaking or gushing from your vagina.   You have a fever.   You pass blood-tinged mucus.   You have vaginal bleeding.   You have continuous abdominal pain.   You have low back pain that you never had before.   You feel your baby's head pushing down and causing pelvic pressure.   Your baby is not moving as much as it used to.  This information is not intended to replace advice given to you by your health care provider. Make sure you discuss any questions you have with your health care provider. Document Released: 06/05/2005 Document Revised: 09/27/2015 Document Reviewed: 03/17/2013  Chartered certified accountant Patient Education  [employer].

## 2016-07-18 NOTE — MAU Note (Signed)
Having some real sharp pains in lower abd. Started a couple nights ago, seems to be getting worse, was concerned. No bleeding or leaking.

## 2016-07-18 NOTE — Progress Notes (Signed)
DR Warden/Maumaw on unit and aware of pt's admission and status. Will see pt

## 2016-07-18 NOTE — MAU Provider Note (Signed)
Chief Complaint:  Abdominal Pain      HPI: Anne Nguyen is a 26 y.o. G3P1011 at 110w5d who presents to maternity admissions reporting bilateral lower pelvic pain that is worse with walking and also notes increasing pubic pressure.  She denies any contractions, loss of fluids or vaginal bleeding.  Denies CP, SOB, NVD, HA, changes in vision or LE edema.  She has not recently had sexual activity.  Denies dysuria, but does endorse incomplete voiding and increased frequency.  Reports good fetal movement.    Past Medical History: Past Medical History:  Diagnosis Date  . Asthma     Past obstetric history: OB History  Gravida Para Term Preterm AB Living  3 1 1   1 1   SAB TAB Ectopic Multiple Live Births  1            # Outcome Date GA Lbr Len/2nd Weight Sex Delivery Anes PTL Lv  3 Current           2 Term 01/02/15 [redacted]w[redacted]d  2.495 kg (5 lb 8 oz) M Vag-Spont     1 SAB              Birth Comments: System Generated. Please review and update pregnancy details.      Past Surgical History: Past Surgical History:  Procedure Laterality Date  . NO PAST SURGERIES    . WISDOM TOOTH EXTRACTION       Family History: Family History  Problem Relation Age of Onset  . Cancer Maternal Grandmother     breast  . Diabetes Paternal Grandmother   . Anesthesia problems Neg Hx     Social History: Social History  Substance Use Topics  . Smoking status: Former Smoker    Packs/day: 0.50    Types: Cigarettes    Quit date: 04/19/2014  . Smokeless tobacco: Former Neurosurgeon  . Alcohol use No    Allergies:  Allergies  Allergen Reactions  . Ciprofloxacin Anaphylaxis and Other (See Comments)    Gets really hot.    Meds:  No prescriptions prior to admission.    I have reviewed patient's Past Medical Hx, Surgical Hx, Family Hx, Social Hx, medications and allergies.   ROS:  A comprehensive ROS was negative except per HPI.    Physical Exam   Patient Vitals for the past 24 hrs:  BP Temp Temp src Pulse  Resp Height Weight  07/18/16 1332 121/71 98.5 F (36.9 C) - 97 18 - -  07/18/16 1141 117/79 - - 117 - - -  07/18/16 1052 112/66 98.2 F (36.8 C) Oral 95 18 5\' 5"  (1.651 m) 69.1 kg (152 lb 4 oz)   Constitutional: 25yo F sitting up in bed looking at phone, appearing comfortable Cardiovascular: RRR, no MRG Respiratory: NWOB, CTABL, no wheezing or rhonchi GI: Abd soft, mild TTP on right and left lower abdomen. No peritoneal signs or guarding.  MS: Extremities nontender, no edema, normal ROM. Paraspinal tenderness at L/T spine.  No spinal tenderness.  Neurologic: Alert and oriented x 4.  GU: Neg CVAT. + suprapubic tenderness.   Dilation: 2 Effacement (%): Thick Cervical Position: Posterior Presentation: Vertex Exam by:: Dr. Omer Jack  FHT:  Baseline 130s , moderate variability, accelerations present, no decelerations Contractions: q 8-10 mins   Labs: Results for orders placed or performed during the hospital encounter of 07/18/16 (from the past 24 hour(s))  Urinalysis, Routine w reflex microscopic     Status: Abnormal   Collection Time: 07/18/16 10:55 AM  Result Value Ref Range   Color, Urine YELLOW YELLOW   APPearance HAZY (A) CLEAR   Specific Gravity, Urine 1.023 1.005 - 1.030   pH 7.0 5.0 - 8.0   Glucose, UA NEGATIVE NEGATIVE mg/dL   Hgb urine dipstick NEGATIVE NEGATIVE   Bilirubin Urine NEGATIVE NEGATIVE   Ketones, ur NEGATIVE NEGATIVE mg/dL   Protein, ur 30 (A) NEGATIVE mg/dL   Nitrite NEGATIVE NEGATIVE   Leukocytes, UA SMALL (A) NEGATIVE   RBC / HPF 0-5 0 - 5 RBC/hpf   WBC, UA 6-30 0 - 5 WBC/hpf   Bacteria, UA RARE (A) NONE SEEN   Squamous Epithelial / LPF 0-5 (A) NONE SEEN   Mucous PRESENT     Imaging:  No results found.  MAU Course: Urinalysis: rare bacteria, 6-30 WBC, no nitrates and small leukocytes Urine culture: pending Pelvic exam: 2cm dilated, thick and posterior with vertex presentation, no cervical change on repeat SVE Fetal monitoring:  Baseline HR 135,  mod variability, pos accels, no decels and irregular contractions  MDM: Plan of care reviewed with patient, including labs and tests ordered and medical treatment.   Assessment: 1. Urinary tract infection in mother during third trimester of pregnancy   2. Abnormal cervical Papanicolaou smear affecting pregnancy, antepartum   3. Supervision of other normal pregnancy, antepartum   4. False labor   5. History of prior pregnancy with IUGR newborn   26yo F presenting with lower abdominal pressure, increased urinary frequency and incomplete voiding with irregular contractions.  UA showed sterile pyuria and urine culture is pending. She was on fetal monitoring and had no events and irregular contractions. Pelvic exam revealed 2cm dilation, thick and posterior. Despite sterile pyuria, patient is symptomatic and it is appropriate to treat for UTI at this time.   Plan: Discharge home in stable condition. Prescribed macrobid x7 days. Discussed return precaution and pre term labor precautions and fetal kick counts  Follow-up Information    [facility]. Go in 1 week(s).   Specialty:  Obstetrics and Gynecology Why:  Follow up in office on 06/23/16 for OB visit Contact information: [address] [place] [address] [phone]          Allergies as of 07/18/2016      Reactions   Ciprofloxacin Anaphylaxis, Other (See Comments)   Gets really hot.      Medication List    TAKE these medications   albuterol 108 (90 Base) MCG/ACT inhaler Commonly known as:  PROVENTIL HFA;VENTOLIN HFA Inhale 2 puffs into the lungs every 6 (six) hours as needed for wheezing or shortness of breath.   calcium carbonate 500 MG chewable tablet Commonly known as:  TUMS - dosed in mg elemental calcium Chew 2 tablets by mouth daily.   famotidine 20 MG tablet Commonly known as:  PEPCID Take 1 tablet (20 mg total) by mouth 2 (two) times  daily.   nitrofurantoin (macrocrystal-monohydrate) 100 MG capsule Commonly known as:  MACROBID Take 1 capsule (100 mg total) by mouth 2 (two) times daily.       Renne Musca, MD PGY-1 07/18/2016 2:29 PM   OB FELLOW MAU DISCHARGE ATTESTATION  I have seen and examined this patient; I agree with above documentation in the resident's note. Patient was very comfortable in the room, even during the contractions picked up on the monitor. During the contractions, she states they felt crampy and sharp but did not feel like contractions in  labor, per patient. She had signs/symptoms of UTI and UA showed pyuria but no signs of pyelonephritis (no fever, no CVAT, no N/V). UCx sent, antibiotics prescribed. She made no cervical change while in MAU, was dilated already to 2cm, but was thick and posterior. Contractions were about 10 min apart. Emphasized to return to MAU if contractions get stronger or closer together. Also encouraged to drink more water (only drinks about 32 oz of water per day, 2 bottles).  Patient has follow up appt on 07/24/16, scheduled patient for outpatient US due to history of IUGR (induced for this reason) and last US (12/6) recommended a follow up growth scan in 4-6 weeks.    I personally reviewed the patient's NST today, found to be REACTIVE. 130 bpm, mod var, +accels, no decels. CTX: q8-10 min.   Jen MowElizabeth Jerianne Anselmo, DO OB Fellow 4:27 PM

## 2016-07-18 NOTE — Progress Notes (Signed)
Dr Omer JackMumaw in to discuss d/c plan. Written and verbal d/c instructions given and understanding voiced.

## 2016-07-19 LAB — CULTURE, OB URINE: Culture: NO GROWTH

## 2016-07-20 ENCOUNTER — Inpatient Hospital Stay (HOSPITAL_COMMUNITY)
Admission: AD | Admit: 2016-07-20 | Discharge: 2016-07-23 | DRG: 778 | Disposition: A | Discharge status: Home / Self Care | Payer: Medicaid Other | Source: Ambulatory Visit | Attending: Obstetrics & Gynecology | Admitting: Obstetrics & Gynecology

## 2016-07-20 ENCOUNTER — Encounter (HOSPITAL_COMMUNITY): Payer: Self-pay | Admitting: *Deleted

## 2016-07-20 DIAGNOSIS — O99513 Diseases of the respiratory system complicating pregnancy, third trimester: Secondary | ICD-10-CM | POA: Diagnosis present

## 2016-07-20 DIAGNOSIS — O98313 Other infections with a predominantly sexual mode of transmission complicating pregnancy, third trimester: Secondary | ICD-10-CM | POA: Diagnosis present

## 2016-07-20 DIAGNOSIS — O368930 Maternal care for other specified fetal problems, third trimester, not applicable or unspecified: Secondary | ICD-10-CM | POA: Diagnosis not present

## 2016-07-20 DIAGNOSIS — Z87891 Personal history of nicotine dependence: Secondary | ICD-10-CM | POA: Diagnosis not present

## 2016-07-20 DIAGNOSIS — O3440 Maternal care for other abnormalities of cervix, unspecified trimester: Secondary | ICD-10-CM

## 2016-07-20 DIAGNOSIS — O479 False labor, unspecified: Secondary | ICD-10-CM

## 2016-07-20 DIAGNOSIS — R87619 Unspecified abnormal cytological findings in specimens from cervix uteri: Secondary | ICD-10-CM

## 2016-07-20 DIAGNOSIS — Z833 Family history of diabetes mellitus: Secondary | ICD-10-CM

## 2016-07-20 DIAGNOSIS — A5901 Trichomonal vulvovaginitis: Secondary | ICD-10-CM | POA: Diagnosis present

## 2016-07-20 DIAGNOSIS — A568 Sexually transmitted chlamydial infection of other sites: Secondary | ICD-10-CM | POA: Diagnosis present

## 2016-07-20 DIAGNOSIS — J4522 Mild intermittent asthma with status asthmaticus: Secondary | ICD-10-CM | POA: Diagnosis present

## 2016-07-20 DIAGNOSIS — O4700 False labor before 37 completed weeks of gestation, unspecified trimester: Secondary | ICD-10-CM

## 2016-07-20 DIAGNOSIS — O4703 False labor before 37 completed weeks of gestation, third trimester: Secondary | ICD-10-CM | POA: Diagnosis present

## 2016-07-20 DIAGNOSIS — Z3A34 34 weeks gestation of pregnancy: Secondary | ICD-10-CM

## 2016-07-20 LAB — URINALYSIS, ROUTINE W REFLEX MICROSCOPIC
Bilirubin Urine: NEGATIVE
Glucose, UA: NEGATIVE mg/dL
Hgb urine dipstick: NEGATIVE
Ketones, ur: NEGATIVE mg/dL
Nitrite: NEGATIVE
Protein, ur: 30 mg/dL — AB
Specific Gravity, Urine: 1.027 (ref 1.005–1.030)
pH: 6 (ref 5.0–8.0)

## 2016-07-20 LAB — CBC
HEMATOCRIT: 34 % — AB (ref 36.0–46.0)
HEMOGLOBIN: 11.3 g/dL — AB (ref 12.0–15.0)
MCH: 24.6 pg — ABNORMAL LOW (ref 26.0–34.0)
MCHC: 33.2 g/dL (ref 30.0–36.0)
MCV: 73.9 fL — ABNORMAL LOW (ref 78.0–100.0)
Platelets: 243 10*3/uL (ref 150–400)
RBC: 4.6 MIL/uL (ref 3.87–5.11)
RDW: 14.5 % (ref 11.5–15.5)
WBC: 6.6 10*3/uL (ref 4.0–10.5)

## 2016-07-20 LAB — OB RESULTS CONSOLE HEPATITIS B SURFACE ANTIGEN: Hepatitis B Surface Ag: NEGATIVE

## 2016-07-20 LAB — GROUP B STREP BY PCR: Group B strep by PCR: NEGATIVE

## 2016-07-20 LAB — WET PREP, GENITAL
SPERM: NONE SEEN
Yeast Wet Prep HPF POC: NONE SEEN

## 2016-07-20 LAB — OB RESULTS CONSOLE RPR: RPR: NONREACTIVE

## 2016-07-20 LAB — TYPE AND SCREEN
ABO/RH(D): O POS
Antibody Screen: NEGATIVE

## 2016-07-20 LAB — OB RESULTS CONSOLE RUBELLA ANTIBODY, IGM: Rubella: IMMUNE

## 2016-07-20 LAB — OB RESULTS CONSOLE ABO/RH: RH TYPE: POSITIVE

## 2016-07-20 LAB — OB RESULTS CONSOLE GBS: GBS: NEGATIVE

## 2016-07-20 LAB — OB RESULTS CONSOLE HIV ANTIBODY (ROUTINE TESTING): HIV: NONREACTIVE

## 2016-07-20 MED ORDER — NIFEDIPINE 10 MG PO CAPS
10.0000 mg | ORAL_CAPSULE | ORAL | Status: DC
Start: 2016-07-20 — End: 2016-07-20
  Administered 2016-07-20: 10 mg via ORAL
  Filled 2016-07-20: qty 1

## 2016-07-20 MED ORDER — CALCIUM CARBONATE ANTACID 500 MG PO CHEW: 2.0000 | CHEWABLE_TABLET | ORAL | Status: DC | PRN

## 2016-07-20 MED ORDER — ONDANSETRON 8 MG PO TBDP
8.0000 mg | ORAL_TABLET | Freq: Once | ORAL | Status: AC
  Administered 2016-07-20: 8 mg via ORAL
  Filled 2016-07-20: qty 1

## 2016-07-20 MED ORDER — NALBUPHINE HCL 10 MG/ML IJ SOLN
10.0000 mg | Freq: Once | INTRAMUSCULAR | Status: AC
  Administered 2016-07-20: 10 mg via INTRAVENOUS
  Filled 2016-07-20: qty 1

## 2016-07-20 MED ORDER — LACTATED RINGERS IV SOLN
INTRAVENOUS | Status: DC
  Administered 2016-07-20: 22:00:00 via INTRAVENOUS

## 2016-07-20 MED ORDER — LACTATED RINGERS IV SOLN
2.0000 g/h | INTRAVENOUS | Status: DC
  Administered 2016-07-20: 2 g/h via INTRAVENOUS
  Filled 2016-07-20: qty 80

## 2016-07-20 MED ORDER — MAGNESIUM SULFATE BOLUS VIA INFUSION
4.0000 g | Freq: Once | INTRAVENOUS | Status: AC
  Administered 2016-07-20: 4 g via INTRAVENOUS
  Filled 2016-07-20: qty 500

## 2016-07-20 MED ORDER — DOCUSATE SODIUM 100 MG PO CAPS
100.0000 mg | ORAL_CAPSULE | Freq: Every day | ORAL | Status: DC
  Administered 2016-07-22: 100 mg via ORAL
  Filled 2016-07-20: qty 1

## 2016-07-20 MED ORDER — HYDROXYZINE HCL 25 MG PO TABS
25.0000 mg | ORAL_TABLET | Freq: Once | ORAL | Status: AC
Start: 2016-07-20 — End: 2016-07-20
  Administered 2016-07-20: 25 mg via ORAL
  Filled 2016-07-20: qty 1

## 2016-07-20 MED ORDER — ZOLPIDEM TARTRATE 5 MG PO TABS: 5.0000 mg | ORAL_TABLET | Freq: Every evening | ORAL | Status: DC | PRN

## 2016-07-20 MED ORDER — PROMETHAZINE HCL 25 MG/ML IJ SOLN
12.5000 mg | Freq: Once | INTRAMUSCULAR | Status: AC
  Administered 2016-07-20: 12.5 mg via INTRAVENOUS
  Filled 2016-07-20: qty 1

## 2016-07-20 MED ORDER — BETAMETHASONE SOD PHOS & ACET 6 (3-3) MG/ML IJ SUSP
12.0000 mg | INTRAMUSCULAR | Status: AC
  Administered 2016-07-20: 12 mg via INTRAMUSCULAR
  Filled 2016-07-20 (×3): qty 2

## 2016-07-20 MED ORDER — METRONIDAZOLE 500 MG PO TABS
2000.0000 mg | ORAL_TABLET | Freq: Once | ORAL | Status: AC
  Administered 2016-07-20: 2000 mg via ORAL
  Filled 2016-07-20: qty 4

## 2016-07-20 MED ORDER — ACETAMINOPHEN 325 MG PO TABS
650.0000 mg | ORAL_TABLET | ORAL | Status: DC | PRN
  Administered 2016-07-21 – 2016-07-22 (×3): 650 mg via ORAL
  Filled 2016-07-20 (×3): qty 2

## 2016-07-20 MED ORDER — PRENATAL MULTIVITAMIN CH
1.0000 | ORAL_TABLET | Freq: Every day | ORAL | Status: DC
  Administered 2016-07-22: 1 via ORAL
  Filled 2016-07-20: qty 1

## 2016-07-20 MED ORDER — TERBUTALINE SULFATE 1 MG/ML IJ SOLN
0.2500 mg | Freq: Once | INTRAMUSCULAR | Status: AC
  Administered 2016-07-20: 0.25 mg via SUBCUTANEOUS
  Filled 2016-07-20: qty 1

## 2016-07-20 MED ORDER — LACTATED RINGERS IV BOLUS (SEPSIS)
1000.0000 mL | Freq: Once | INTRAVENOUS | Status: AC
  Administered 2016-07-20: 1000 mL via INTRAVENOUS

## 2016-07-20 NOTE — Progress Notes (Addendum)
1800: G3P1 @ 34+[redacted] wksga. Presents to triage for ctx and pressure unchanged from two days prior. States ctx more frequent. Denies LOF orbleeding. +FM.   1840: Provider at bs assessing pt and discussing POC. Wet prep, cultures, and pelvic exam done. OGAR medical student performed above with supervision of CNM. SVE 3/50/0  1900: Orders received to draw labs and start IV  1910: labs drawn and IV started with bolus LR per provider's orders.   1940: orders received to admit pt upstairs, stop procardia dosing and MgS04 to be started at high risk Ob. [facility] Pt to [facility].   Pt taken to HROB via wheelchair.

## 2016-07-20 NOTE — Progress Notes (Signed)
Pt alseep. Toco no ut ctx Continue with present management

## 2016-07-20 NOTE — MAU Note (Signed)
Pt reports she is have ctxs and pain and pressure off and off all week. Was here on Tuesday but they feel worse today. Stated she thinks her mucus plug came out on Tuesday. Was dilated 2cm when she left.

## 2016-07-20 NOTE — H&P (Signed)
Anne Nguyen is a 26 y.o. female G3P1011 @34 .4 weeks by 6 wk US presenting for threatened PTL. She presents with ctx q15-20 min and lower pelvic pain. She was seen 2 days ago for similar sx but reports worsening of sx. She reports good FM. No VB or LOF. Evaluation in MAU reveals cervical change and ctx q2-6 min. Wet prep showed +trich. She received care in [place] initially then transferred care to [place].   OB History    Gravida Para Term Preterm AB Living   3 1 1   1 1    SAB TAB Ectopic Multiple Live Births   1             Past Medical History:  Diagnosis Date  . Asthma    Past Surgical History:  Procedure Laterality Date  . NO PAST SURGERIES    . WISDOM TOOTH EXTRACTION     Family History: family history includes Cancer in her maternal grandmother; Diabetes in her paternal grandmother. Social History:  reports that she quit smoking about 2 years ago. Her smoking use included Cigarettes. She smoked 0.50 packs per day. She has quit using smokeless tobacco. She reports that she does not drink alcohol or use drugs.     Maternal Diabetes: No Genetic Screening: Normal Maternal Ultrasounds/Referrals: Normal Fetal Ultrasounds or other Referrals:  None Maternal Substance Abuse:  No Significant Maternal Medications:  None Significant Maternal Lab Results:  None Other Comments:  None  Review of Systems  Constitutional: Negative for fever.  Gastrointestinal: Positive for abdominal pain.  Genitourinary: Negative.    History   Blood pressure 119/66, pulse 104, temperature 97.9 F (36.6 C), temperature source Oral, resp. rate 18, height 5\' 5"  (1.651 m), weight 69.3 kg (152 lb 12.8 oz), last menstrual period 11/14/2015, unknown if currently breastfeeding. Maternal Exam:  Uterine Assessment: Contraction strength is moderate.  Contraction duration is 6 minutes. Contraction frequency is regular.   Abdomen: Patient reports no abdominal tenderness. Fundal height is 35 cm.   Fetal presentation:  vertex  Introitus: Normal vulva. Vagina is positive for vaginal discharge (thin, white).  Cervix: Cervix evaluated by sterile speculum exam and digital exam.     Fetal Exam Fetal Monitor Review: Mode: ultrasound.   Baseline rate: 130.  Variability: moderate (6-25 bpm).   Pattern: accelerations present and no decelerations.    Fetal State Assessment: Category I - tracings are normal.     Physical Exam  Constitutional: She is oriented to person, place, and time. She appears well-developed and well-nourished. No distress.  HENT:  Head: Normocephalic and atraumatic.  Neck: Normal range of motion.  Respiratory: Effort normal.  GI: Soft. She exhibits no distension. There is no tenderness.  Genitourinary: Vaginal discharge (thin, white) found.  Genitourinary Comments: SVE 3/60/0, vtx  Musculoskeletal: Normal range of motion.  Neurological: She is alert and oriented to person, place, and time.  Skin: Skin is warm and dry.  Psychiatric: She has a normal mood and affect.    Prenatal labs: ABO, Rh:  O POS Antibody:  NEG Rubella:  IMMUNE RPR: NON REAC (12/18 1044)  HBsAg:   NR HIV: NONREACTIVE (12/18 1044)  GBS:     Assessment/Plan: Admit to High Risk BMZ Magnesium Sulfate GBS pcr Flagyl for +trich Management per Dr. Kem KaysErvin   Melanie Bhambri, CNM 07/20/2016, 7:34 PM  As noted above. Will admit for magnesium tocolysis to allow celestone. Celestone for FLM. POC discussed with family and pt. Verbalized understanding of POC  Nettie ElmMichael Sienna Stonehocker, MD

## 2016-07-20 NOTE — MAU Provider Note (Signed)
Chief Complaint:  Contractions   None     HPI: Anne Nguyen is a 26 y.o. G3P1011 at 5726w4d who presents with mother at bedside to maternity admissions reporting worsening suprapubic & lower pack pain unresponsive to nitrofurantoin treatment. The patient reports the suprapubic pain is 6/10, sudden onset, paroxysmal,  sharp, and non-radiating. The pain is worsen with walking or standing(up to 8/10). Her back pain is 4/10, throbbing, paroxysmal,  gradual onset, and non-radiating. She reports needing a broad based gait when ambulating.  She denies dysuria, fever, chills, nausea, vomiting, parasthenia, and numbness.   She endorses regular contractions every 15 minutes. She endorses loss of her mucus plug at 1/30, but otherwise no leakage of fluid or vaginal bleeding. Good fetal movement.   Pregnancy Course:   Past Medical History: Past Medical History:  Diagnosis Date  . Asthma     Past obstetric history: OB History  Gravida Para Term Preterm AB Living  3 1 1   1 1   SAB TAB Ectopic Multiple Live Births  1            # Outcome Date GA Lbr Len/2nd Weight Sex Delivery Anes PTL Lv  3 Current           2 Term 01/02/15 3842w5d  2.495 kg (5 lb 8 oz) M Vag-Spont     1 SAB              Birth Comments: System Generated. Please review and update pregnancy details.      Past Surgical History: Past Surgical History:  Procedure Laterality Date  . NO PAST SURGERIES    . WISDOM TOOTH EXTRACTION       Family History: Family History  Problem Relation Age of Onset  . Cancer Maternal Grandmother     breast  . Diabetes Paternal Grandmother   . Anesthesia problems Neg Hx     Social History: Social History  Substance Use Topics  . Smoking status: Former Smoker    Packs/day: 0.50    Types: Cigarettes    Quit date: 04/19/2014  . Smokeless tobacco: Former NeurosurgeonUser  . Alcohol use No    Allergies:  Allergies  Allergen Reactions  . Ciprofloxacin Anaphylaxis and Other (See Comments)    Gets really  hot.    Meds:  Prescriptions Prior to Admission  Medication Sig Dispense Refill Last Dose  . calcium carbonate (TUMS - DOSED IN MG ELEMENTAL CALCIUM) 500 MG chewable tablet Chew 2 tablets by mouth daily.   Past Week at Unknown time  . albuterol (PROVENTIL HFA;VENTOLIN HFA) 108 (90 Base) MCG/ACT inhaler Inhale 2 puffs into the lungs every 6 (six) hours as needed for wheezing or shortness of breath. 1 Inhaler 2 Rescue  . famotidine (PEPCID) 20 MG tablet Take 1 tablet (20 mg total) by mouth 2 (two) times daily. (Patient not taking: Reported on 07/20/2016) 60 tablet 3 Not Taking at Unknown time  . nitrofurantoin, macrocrystal-monohydrate, (MACROBID) 100 MG capsule Take 1 capsule (100 mg total) by mouth 2 (two) times daily. 14 capsule 0 Has not started    I have reviewed patient's Past Medical Hx, Surgical Hx, Family Hx, Social Hx, medications and allergies.   ROS:  A comprehensive ROS was negative except per HPI.    Physical Exam   Patient Vitals for the past 24 hrs:  BP Temp Temp src Pulse Resp Height Weight  07/20/16 1749 119/66 97.9 F (36.6 C) Oral 104 18 5\' 5"  (1.651 m) 69.3 kg (152 lb  12.8 oz)   Constitutional: Well-developed, well-nourished female in no acute distress.  Cardiovascular: RRR, normal S1 & s2, no M/R/G Respiratory: normal work of breathing, CTABL, no wheezes, rales, or rhonchi GI: Abd soft, gravid appropriate for gestational age. Tender to palpation over suprapubic region, Pos BS x 4 MS: Extremities nontender, no edema, normal ROM Back: no paraspinal or intervertebral joint tenderness,  L4 level tenderness bilaterally.  Neurologic: Alert and oriented x 3  GU: Neg CVAT.  Pelvic: NEFG, physiologic discharge, no blood, cervix clean. No CMT     FHT:  Baseline 130, moderate variability, accelerations present, no decelerations Contractions: q 15 mins   Labs: Results for orders placed or performed during the hospital encounter of 07/20/16 (from the past 24 hour(s))   Urinalysis, Routine w reflex microscopic     Status: Abnormal   Collection Time: 07/20/16  5:45 PM  Result Value Ref Range   Color, Urine YELLOW YELLOW   APPearance CLEAR CLEAR   Specific Gravity, Urine 1.027 1.005 - 1.030   pH 6.0 5.0 - 8.0   Glucose, UA NEGATIVE NEGATIVE mg/dL   Hgb urine dipstick NEGATIVE NEGATIVE   Bilirubin Urine NEGATIVE NEGATIVE   Ketones, ur NEGATIVE NEGATIVE mg/dL   Protein, ur 30 (A) NEGATIVE mg/dL   Nitrite NEGATIVE NEGATIVE   Leukocytes, UA SMALL (A) NEGATIVE   RBC / HPF 0-5 0 - 5 RBC/hpf   WBC, UA 6-30 0 - 5 WBC/hpf   Bacteria, UA RARE (A) NONE SEEN   Squamous Epithelial / LPF 0-5 (A) NONE SEEN   Mucous PRESENT     Imaging:  No results found.  MAU Course: Obtain GCC & Wet prep & repeat Ua, discussion necessity of admission to patient   MDM: Plan of care reviewed with patient, including labs and tests ordered and medical treatment.   Assessment: 1. Abnormal cervical Papanicolaou smear affecting pregnancy, antepartum     Plan: Admit for possible preterm labor Initiate betamethasone injections for fetal lung development Instruct patient to D/C nitrofurantoin due to benign UA & UCx     Collie Siad, Medical Student MS3 07/20/2016 7:09 PM

## 2016-07-20 NOTE — Progress Notes (Signed)
OB Attending  Pt still with painful ut ctx. Magnesium bolus has been completed. SVE cervix unchanged Will give SQ terb and pain medication. Will follow

## 2016-07-21 ENCOUNTER — Inpatient Hospital Stay (HOSPITAL_COMMUNITY): Payer: Medicaid Other | Admitting: Anesthesiology

## 2016-07-21 ENCOUNTER — Encounter (HOSPITAL_COMMUNITY): Payer: Self-pay

## 2016-07-21 DIAGNOSIS — Z3A34 34 weeks gestation of pregnancy: Secondary | ICD-10-CM

## 2016-07-21 DIAGNOSIS — O98313 Other infections with a predominantly sexual mode of transmission complicating pregnancy, third trimester: Secondary | ICD-10-CM

## 2016-07-21 DIAGNOSIS — O368930 Maternal care for other specified fetal problems, third trimester, not applicable or unspecified: Secondary | ICD-10-CM

## 2016-07-21 LAB — GC/CHLAMYDIA PROBE AMP (~~LOC~~) NOT AT ARMC
Chlamydia: POSITIVE — AB
Neisseria Gonorrhea: NEGATIVE

## 2016-07-21 LAB — RPR: RPR: NONREACTIVE

## 2016-07-21 MED ORDER — SOD CITRATE-CITRIC ACID 500-334 MG/5ML PO SOLN: 30.0000 mL | ORAL | Status: DC | PRN

## 2016-07-21 MED ORDER — LACTATED RINGERS IV SOLN
INTRAVENOUS | Status: DC
  Administered 2016-07-21 (×2): via INTRAVENOUS

## 2016-07-21 MED ORDER — EPHEDRINE 5 MG/ML INJ
10.0000 mg | INTRAVENOUS | Status: DC | PRN
  Filled 2016-07-21: qty 4

## 2016-07-21 MED ORDER — LIDOCAINE HCL (PF) 1 % IJ SOLN
INTRAMUSCULAR | Status: DC | PRN
  Administered 2016-07-21 (×2): 7 mL via EPIDURAL

## 2016-07-21 MED ORDER — HYDROCODONE-ACETAMINOPHEN 5-325 MG PO TABS
1.0000 | ORAL_TABLET | Freq: Once | ORAL | Status: AC
  Administered 2016-07-21: 2 via ORAL
  Filled 2016-07-21: qty 2

## 2016-07-21 MED ORDER — CYCLOBENZAPRINE HCL 10 MG PO TABS
10.0000 mg | ORAL_TABLET | Freq: Three times a day (TID) | ORAL | Status: DC | PRN
  Administered 2016-07-22 (×2): 10 mg via ORAL
  Filled 2016-07-21 (×3): qty 1

## 2016-07-21 MED ORDER — PHENYLEPHRINE 40 MCG/ML (10ML) SYRINGE FOR IV PUSH (FOR BLOOD PRESSURE SUPPORT)
80.0000 ug | PREFILLED_SYRINGE | INTRAVENOUS | Status: DC | PRN
  Filled 2016-07-21: qty 10
  Filled 2016-07-21: qty 5

## 2016-07-21 MED ORDER — DIPHENHYDRAMINE HCL 50 MG/ML IJ SOLN: 12.5000 mg | INTRAMUSCULAR | Status: DC | PRN

## 2016-07-21 MED ORDER — ONDANSETRON HCL 4 MG/2ML IJ SOLN
4.0000 mg | Freq: Four times a day (QID) | INTRAMUSCULAR | Status: DC | PRN
Start: 2016-07-21 — End: 2016-07-23

## 2016-07-21 MED ORDER — LACTATED RINGERS IV SOLN: 500.0000 mL | INTRAVENOUS | Status: DC | PRN

## 2016-07-21 MED ORDER — OXYTOCIN BOLUS FROM INFUSION: 500.0000 mL | Freq: Once | INTRAVENOUS | Status: DC

## 2016-07-21 MED ORDER — OXYTOCIN 40 UNITS IN LACTATED RINGERS INFUSION - SIMPLE MED
2.5000 [IU]/h | INTRAVENOUS | Status: DC
  Filled 2016-07-21: qty 1000

## 2016-07-21 MED ORDER — AZITHROMYCIN 1 G PO PACK
1.0000 g | PACK | Freq: Once | ORAL | Status: AC
  Administered 2016-07-21: 1 g via ORAL
  Filled 2016-07-21: qty 1

## 2016-07-21 MED ORDER — NIFEDIPINE ER OSMOTIC RELEASE 30 MG PO TB24
30.0000 mg | ORAL_TABLET | Freq: Every day | ORAL | Status: DC
  Administered 2016-07-21 – 2016-07-22 (×2): 30 mg via ORAL
  Filled 2016-07-21 (×2): qty 1

## 2016-07-21 MED ORDER — FENTANYL 2.5 MCG/ML BUPIVACAINE 1/10 % EPIDURAL INFUSION (WH - ANES)
14.0000 mL/h | INTRAMUSCULAR | Status: DC | PRN
  Administered 2016-07-21: 14 mL/h via EPIDURAL
  Filled 2016-07-21: qty 100

## 2016-07-21 MED ORDER — LIDOCAINE HCL (PF) 1 % IJ SOLN
30.0000 mL | INTRAMUSCULAR | Status: DC | PRN
  Filled 2016-07-21: qty 30

## 2016-07-21 MED ORDER — LACTATED RINGERS IV SOLN: 500.0000 mL | Freq: Once | INTRAVENOUS | Status: DC

## 2016-07-21 MED ORDER — OXYCODONE-ACETAMINOPHEN 5-325 MG PO TABS: 1.0000 | ORAL_TABLET | ORAL | Status: DC | PRN

## 2016-07-21 MED ORDER — FLEET ENEMA 7-19 GM/118ML RE ENEM: 1.0000 | ENEMA | RECTAL | Status: DC | PRN

## 2016-07-21 MED ORDER — FENTANYL CITRATE (PF) 100 MCG/2ML IJ SOLN: 100.0000 ug | INTRAMUSCULAR | Status: DC | PRN

## 2016-07-21 MED ORDER — FENTANYL CITRATE (PF) 100 MCG/2ML IJ SOLN
100.0000 ug | INTRAMUSCULAR | Status: DC | PRN
  Administered 2016-07-22: 100 ug via INTRAVENOUS
  Filled 2016-07-21: qty 2

## 2016-07-21 NOTE — Progress Notes (Signed)
S: Patient seen & examined for progress of labor. Patient with epidural, still feeling contractions and pressure between her legs. Difficulty tracking on the monitor, but but patient reporting discomfort every 3-5 minutes.     O:  Vitals:   07/21/16 0300 07/21/16 0330 07/21/16 0400 07/21/16 0430  BP: 116/70 117/79 119/80 111/72  Pulse: (!) 105 (!) 101 (!) 112 (!) 107  Resp: 16  16 16   Temp:      TempSrc:      Weight:      Height:        Dilation: 5 Effacement (%): 70 Station: -1 Presentation: Vertex Exam by:: Rakia Frayne Cambell,MD (Sade Harper,RN)   FHT: 120s bpm, mod var, +accels, no decels TOCO: difficulty detecting, irregular pattern   A/P: Epidural in place Continue expectant management Anticipate SVD   Gorden HarmsMegan Salam [place], MD PGY-2 07/21/2016 4:51 AM

## 2016-07-21 NOTE — Progress Notes (Addendum)
Patient ID: Anne SeverinMykia Nguyen, female   DOB: 08-01-1990, 26 y.o.   MRN: [id number]  Epidural off at least 4 hours now VSS, afeb FHR 120s, +accels, no decels Some UI, no ctx pattern Cx- unchanged (3+/thick/-2/post)  IUP@34 .5wks Preterm cx dilation Trich- tx  Will tx to Antepartum unit Give second BMZ this evening NSTs q shift unless begins to contract more frequently Plan to keep through the weekend for PTL obs  Cam HaiSHAW, KIMBERLY CNM 07/21/2016 4:05 PM

## 2016-07-21 NOTE — Progress Notes (Signed)
OB Attending  CTSP by nursing d/t ut ctx  Pt reports feeling pain in her back every 3-5 minutes. Ctx not picking up on toco. FHT's 140's SVE 4-5/70%/-1  A/P IUP 34 5/7 weeks PTL  Cervical progression dispite tocolyis. Will d/c magnesium and transfer pt to L&D for management of labor.

## 2016-07-21 NOTE — Anesthesia Procedure Notes (Signed)
Epidural Patient location during procedure: OB Start time: 07/21/2016 2:04 AM End time: 07/21/2016 2:10 AM  Staffing Anesthesiologist: Leilani AbleHATCHETT, Yasmyn Bellisario Performed: anesthesiologist   Preanesthetic Checklist Completed: patient identified, surgical consent, pre-op evaluation, timeout performed, IV checked, risks and benefits discussed and monitors and equipment checked  Epidural Patient position: sitting Prep: site prepped and draped and DuraPrep Patient monitoring: continuous pulse ox and blood pressure Approach: midline Location: L3-L4 Injection technique: LOR air  Needle:  Needle type: Tuohy  Needle gauge: 17 G Needle length: 9 cm and 9 Needle insertion depth: 5 cm cm Catheter type: closed end flexible Catheter size: 19 Gauge Catheter at skin depth: 10 cm Test dose: negative and Other  Assessment Sensory level: T9 Events: blood not aspirated, injection not painful, no injection resistance, negative IV test and no paresthesia  Additional Notes Reason for block:procedure for pain

## 2016-07-21 NOTE — Anesthesia Preprocedure Evaluation (Signed)
Anesthesia Evaluation  Patient identified by MRN, date of birth, ID band Patient awake    Reviewed: Allergy & Precautions, H&P , NPO status , Patient's Chart, lab work & pertinent test results  Airway Mallampati: I  TM Distance: >3 FB Neck ROM: full    Dental no notable dental hx.    Pulmonary former smoker,    Pulmonary exam normal        Cardiovascular negative cardio ROS Normal cardiovascular exam     Neuro/Psych negative neurological ROS  negative psych ROS   GI/Hepatic negative GI ROS, Neg liver ROS,   Endo/Other  negative endocrine ROS  Renal/GU negative Renal ROS  negative genitourinary   Musculoskeletal negative musculoskeletal ROS (+)   Abdominal Normal abdominal exam  (+)   Peds  Hematology negative hematology ROS (+)   Anesthesia Other Findings   Reproductive/Obstetrics (+) Pregnancy                             Anesthesia Physical Anesthesia Plan  ASA: II  Anesthesia Plan: Epidural   Post-op Pain Management:    Induction:   Airway Management Planned:   Additional Equipment:   Intra-op Plan:   Post-operative Plan:   Informed Consent: I have reviewed the patients History and Physical, chart, labs and discussed the procedure including the risks, benefits and alternatives for the proposed anesthesia with the patient or authorized representative who has indicated his/her understanding and acceptance.     Plan Discussed with:   Anesthesia Plan Comments:         Anesthesia Quick Evaluation

## 2016-07-21 NOTE — Anesthesia Pain Management Evaluation Note (Signed)
  CRNA Pain Management Visit Note  Patient: Anne Nguyen, 26 y.o., female  "Hello I am a member of the anesthesia team at [facility]. We have an anesthesia team available at all times to provide care throughout the hospital, including epidural management and anesthesia for C-section. I don't know your plan for the delivery whether it a natural birth, water birth, IV sedation, nitrous supplementation, doula or epidural, but we want to meet your pain goals."   1.Was your pain managed to your expectations on prior hospitalizations?   Yes   2.What is your expectation for pain management during this hospitalization?     Epidural  3.How can we help you reach that goal? unsure  Record the patient's initial score and the patient's pain goal.   Pain: 3  Pain Goal: 8 [facility] wants you to be able to say your pain was always managed very well.  Cephus ShellingBURGER,Joseph Johns 07/21/2016

## 2016-07-21 NOTE — Progress Notes (Signed)
Pt and her mother refused BMZ Writer explained the reason for the medication but they still refused. Pts' mother is combative about her daughters care stating "they are putting too much medication in her. Pt also stated that she does not have "any STD" because the first test came back negative". We will inform MD.

## 2016-07-21 NOTE — Progress Notes (Signed)
Patient ID: Marisa SeverinMykia Nguyen, female   DOB: 06/09/1991, 26 y.o.   MRN: [id number]  Pt comfortable w/ epidural; not feeling anything; s/p BMZ@1930  2/1  BP 116/81, other VSS FHR 125-130, +accels, no decels No ctx pattern; some irritability Cx 3+/thick/-2  IUP@34 .5wks Preterm cx change  Will turn off epidural pump for now and reeval cx/ctx Dr Vergie LivingPickens updated and will see Family updated and questions answered; understandably upset  Cam HaiSHAW, Dontreal Miera CNM 07/21/2016 11:39 AM

## 2016-07-21 NOTE — Progress Notes (Signed)
L&D Note  07/21/2016 - 1:13 PM  26 y.o. G3P1011 6025w5d. Pregnancy complicated by trich dx on admission, LSIL, MI asthma  Patient Active Problem List   Diagnosis Date Noted  . Threatened preterm labor, third trimester 07/20/2016  . Abnormal cervical Papanicolaou smear affecting pregnancy, antepartum 06/05/2016  . Asthma, mild intermittent 05/15/2016  . Supervision of normal pregnancy, antepartum 05/15/2016    [name] is admitted for PTL   Subjective:  Comfortable; confused from plan of care and why not in PTL  Objective:   Vitals:   07/21/16 1100 07/21/16 1130 07/21/16 1200 07/21/16 1232  BP: 125/68 111/62 107/62 112/72  Pulse: 99 94 91 (!) 101  Resp: 18 16 16 16   Temp:      TempSrc:      Weight:      Height:        Current Vital Signs 24h Vital Sign Ranges  T 98.2 F (36.8 C) Temp  Avg: 98.1 F (36.7 C)  Min: 97.7 F (36.5 C)  Max: 98.5 F (36.9 C)  BP 112/72 BP  Min: 93/48  Max: 131/70  HR (!) 101 Pulse  Avg: 106.8  Min: 84  Max: 137  RR 16 Resp  Avg: 16.6  Min: 16  Max: 20  SaO2   Not Delivered No Data Recorded       24 Hour I/O Current Shift I/O  Time Ins Outs 02/01 0701 - 02/02 0700 In: 0  Out: 375 [Urine:375] 02/02 0701 - 02/02 1900 In: -  Out: 550 [Urine:550]   FHR: 130 baseline +accels, no decel, mod variability Toco: quiet Gen: NAD SVE: 3.5cm per Philipp DeputyKim Shaw CNM @ 1115am  Labs:   Recent Labs Lab 07/20/16 1910  WBC 6.6  HGB 11.3*  HCT 34.0*  PLT 243   No results for input(s): NA, K, CL, CO2, BUN, CREATININE, CALCIUM, PROT, BILITOT, ALKPHOS, ALT, AST, GLUCOSE in the last 168 hours.  Invalid input(s): LABALBU  Medications Current Facility-Administered Medications  Medication Dose Route Frequency Provider Last Rate Last Dose  . acetaminophen (TYLENOL) tablet 650 mg  650 mg Oral Q4H PRN Donette LarryMelanie Bhambri, CNM   650 mg at 07/21/16 0416  . betamethasone acetate-betamethasone sodium phosphate (CELESTONE) injection 12 mg  12 mg Intramuscular Q24  Hr x 2 Donette LarryMelanie Bhambri, CNM   12 mg at 07/20/16 1934  . calcium carbonate (TUMS - dosed in mg elemental calcium) chewable tablet 400 mg of elemental calcium  2 tablet Oral Q4H PRN Donette LarryMelanie Bhambri, CNM      . diphenhydrAMINE (BENADRYL) injection 12.5 mg  12.5 mg Intravenous Q15 min PRN Leilani AbleFranklin Hatchett, MD      . docusate sodium (COLACE) capsule 100 mg  100 mg Oral Daily Donette LarryMelanie Bhambri, CNM      . ePHEDrine injection 10 mg  10 mg Intravenous PRN Leilani AbleFranklin Hatchett, MD      . ePHEDrine injection 10 mg  10 mg Intravenous PRN Leilani AbleFranklin Hatchett, MD      . fentaNYL (SUBLIMAZE) injection 100 mcg  100 mcg Intravenous Q1H PRN Lise AuerMegan C Campbell, MD      . fentaNYL 2.5 mcg/ml w/bupivacaine 0.1% in NS 100ml epidural infusion (WH-ANES)  14 mL/hr Epidural Continuous PRN Leilani AbleFranklin Hatchett, MD   Stopped at 07/21/16 1124  . lactated ringers infusion 500 mL  500 mL Intravenous Once Leilani AbleFranklin Hatchett, MD      . lactated ringers infusion 500-1,000 mL  500-1,000 mL Intravenous PRN Lise AuerMegan C Campbell, MD      .  lactated ringers infusion   Intravenous Continuous Hermina Staggers, MD 100 mL/hr at 07/20/16 2223    . lactated ringers infusion   Intravenous Continuous Lise Auer, MD 125 mL/hr at 07/21/16 0608    . lidocaine (PF) (XYLOCAINE) 1 % injection 30 mL  30 mL Subcutaneous PRN Lise Auer, MD      . ondansetron [facility]) injection 4 mg  4 mg Intravenous Q6H PRN Lise Auer, MD      . oxyCODONE-acetaminophen (PERCOCET/ROXICET) 5-325 MG per tablet 1 tablet  1 tablet Oral Q4H PRN Lise Auer, MD      . oxytocin (PITOCIN) IV BOLUS FROM BAG  500 mL Intravenous Once Lise Auer, MD      . oxytocin (PITOCIN) IV infusion 40 units in LR 1000 mL - Premix  2.5 Units/hr Intravenous Continuous Lise Auer, MD      . PHENYLephrine 40 mcg/ml in normal saline Adult IV Push Syringe  80 mcg Intravenous PRN Leilani Able, MD      . PHENYLephrine 40 mcg/ml in normal saline Adult IV Push Syringe  80 mcg  Intravenous PRN Leilani Able, MD      . prenatal multivitamin tablet 1 tablet  1 tablet Oral Q1200 Donette Larry, CNM      . sodium citrate-citric acid (ORACIT) solution 30 mL  30 mL Oral Q2H PRN Lise Auer, MD      . sodium phosphate (FLEET) 7-19 GM/118ML enema 1 enema  1 enema Rectal PRN Lise Auer, MD      . zolpidem (AMBIEN) tablet 5 mg  5 mg Oral QHS PRN Hermina Staggers, MD       Facility-Administered Medications Ordered in Other Encounters  Medication Dose Route Frequency Provider Last Rate Last Dose  . lidocaine (PF) (XYLOCAINE) 1 % injection    Anesthesia Intra-op Leilani Able, MD   7 mL at 07/21/16 0214    Assessment & Plan:  Pt stable *IUP: category I with accels *PTL: d/w her and family re: situation. I told them that it's good that she hopefully isn't in PTL. Will d/c epidural and see how UCs and s/s (pt currently asymptomatic) and then recheck her cx 4-5h after stopping the epidural. Pt is skinny and should be any to pick up UCs well on toco. If unchanged, pt can go to [facility] and will keep at least through the weekend. BMZ #2 @1930  today *GBS: neg on PCR *Analgesia: stopped epidural to see if  *ID: tx for trich on admission  Cornelia Copa. MD Attending Center for [facility])

## 2016-07-22 DIAGNOSIS — Z3A34 34 weeks gestation of pregnancy: Secondary | ICD-10-CM

## 2016-07-22 DIAGNOSIS — O368930 Maternal care for other specified fetal problems, third trimester, not applicable or unspecified: Secondary | ICD-10-CM

## 2016-07-22 DIAGNOSIS — O98313 Other infections with a predominantly sexual mode of transmission complicating pregnancy, third trimester: Secondary | ICD-10-CM

## 2016-07-22 MED ORDER — LACTATED RINGERS IV BOLUS (SEPSIS)
500.0000 mL | Freq: Once | INTRAVENOUS | Status: DC
  Administered 2016-07-22: 500 mL via INTRAVENOUS

## 2016-07-22 MED ORDER — SODIUM CHLORIDE 0.9% FLUSH
3.0000 mL | Freq: Two times a day (BID) | INTRAVENOUS | Status: DC
  Administered 2016-07-22 (×2): 3 mL via INTRAVENOUS

## 2016-07-22 NOTE — Progress Notes (Signed)
Philipp DeputyKim Shaw, CNMW at bedside and ordered Fentanyl 100 mcg for contraction pain 8/10. Same administered. We will continue to monitor.

## 2016-07-22 NOTE — Progress Notes (Signed)
LR 500 mls bolus administered.

## 2016-07-22 NOTE — Progress Notes (Signed)
Pt C/O back spasm 8/10 Flexeril 10 mg  p o  Administered.  K-Pack offered. We will continue to monitor.

## 2016-07-22 NOTE — Progress Notes (Signed)
Pt reported that she went to the bathroom and suddenly she felt a gush of water running down her legs. It was clear, denies pain or discomfort. Toco and FH Monitor resumed. FHR 125 bpm.  Temp 98 oral  B/P 128/83 HR 113 RR 16 02 Sat 100% R/A . We will inform Philipp DeputyKim Shaw CNMW.

## 2016-07-22 NOTE — Anesthesia Postprocedure Evaluation (Signed)
Anesthesia Post Note  Patient: Anne Nguyen  Procedure(s) Performed: * No procedures listed *  Patient location during evaluation: [facility] Anesthesia Type: Epidural Level of consciousness: awake and alert Pain management: pain level controlled Vital Signs Assessment: post-procedure vital signs reviewed and stable Respiratory status: spontaneous breathing, nonlabored ventilation and respiratory function stable Cardiovascular status: stable Postop Assessment: no headache, no backache and epidural receding Anesthetic complications: no        Last Vitals:  Vitals:   07/22/16 0534 07/22/16 0727  BP: 129/83 110/73  Pulse: (!) 117 86  Resp:  16  Temp: 36.7 C 36.6 C    Last Pain:  Vitals:   07/22/16 0815  TempSrc:   PainSc: 6    Pain Goal: Patients Stated Pain Goal: 3 (07/22/16 0604)               Junious SilkGILBERT,Keyla Milone

## 2016-07-22 NOTE — Progress Notes (Signed)
Pt c/o uterine contraction. Patient placed on Toco and FH Monitor. Philipp DeputyKim Shaw CNMW on Unit and was made aware. We will continue to monitor.

## 2016-07-22 NOTE — Plan of Care (Signed)
Problem: Coping: Goal: Ability to identify and develop effective coping behavior will improve Outcome: Not Met (add Reason) Pt is unable to accept test results at this time.

## 2016-07-22 NOTE — Progress Notes (Signed)
Pt C/O abdominal pain 7/10 Vicodin 2 tabs administered as ordered. We will continue to monitor.

## 2016-07-22 NOTE — Progress Notes (Signed)
Patient ID: [name], female   DOB: May 27, 1991, 26 y.o.   MRN: [id number]  Pt resting; given Zithromax 1gm earlier; refused 2nd BMZ; family requesting cx exam  VSS, afeb Cx unchanged (3+/thick/high) Uterine irritability on monitor  IUP@34 .6wks Preterm cx change- stable +chlamydia  Reassured re no cervical change Discussed with boyfriend the need to him to be tx for Chlamydia and Trichomonas- questions answered  Cam HaiSHAW, Latoya Diskin CNM 07/22/2016 1:22 AM

## 2016-07-22 NOTE — Progress Notes (Signed)
Patient ID: Anne SeverinMykia Nguyen, female   DOB: 1990-11-27, 26 y.o.   MRN: [id number] FACULTY PRACTICE ANTEPARTUM(COMPREHENSIVE) NOTE  Anne Nguyen is a 26 y.o. G3P1011 with Estimated Date of Delivery: 08/27/16   By  early ultrasound 6731w6d  who is admitted for Preterm labor.    Fetal presentation is cephalic. Length of Stay:  2  Days  Date of admission:07/20/2016  Subjective: No contractions Patient reports the fetal movement as active. Patient reports uterine contraction  activity as none. Patient reports  vaginal bleeding as none. Patient describes fluid per vagina as None.  Vitals:  Blood pressure (!) 110/58, pulse 89, temperature 98.7 F (37.1 C), temperature source Oral, resp. rate 14, height 5\' 5"  (1.651 m), weight 152 lb (68.9 kg), last menstrual period 11/14/2015, SpO2 98 %, unknown if currently breastfeeding. Vitals:   07/22/16 0530 07/22/16 0534 07/22/16 0727 07/22/16 1118  BP:  129/83 110/73 (!) 110/58  Pulse: (!) 115 (!) 117 86 89  Resp:   16 14  Temp:  98 F (36.7 C) 97.9 F (36.6 C) 98.7 F (37.1 C)  TempSrc:  Oral Oral Oral  SpO2: 100% 100% 100% 98%  Weight:      Height:       Physical Examination:  General appearance - alert, well appearing, and in no distress Fundal Height:  size equals dates Pelvic Exam:  normal external genitalia, vulva, vagina, cervix, uterus and adnexa, examination not indicated Cervical Exam:  and found to be 3 cm/ Long/-2 and fetal presentation is cephalic. Extremities: extremities normal, atraumatic, no cyanosis or edema with DTRs 2+ bilaterally Membranes:intact  Fetal Monitoring:     Reactive NST  Labs:  No results found for this or any previous visit (from the past 24 hour(s)).  Imaging Studies:      Medications:  Scheduled . betamethasone acetate-betamethasone sodium phosphate  12 mg Intramuscular Q24 Hr x 2  . docusate sodium  100 mg Oral Daily  . lactated ringers  500 mL Intravenous Once  . NIFEdipine  30 mg Oral Daily  . prenatal  multivitamin  1 tablet Oral Q1200  . sodium chloride flush  3 mL Intravenous Q12H   I have reviewed the patient's current medications.  ASSESSMENT: [id number] 4131w6d Estimated Date of Delivery: 08/27/16  Patient Active Problem List   Diagnosis Date Noted  . Threatened preterm labor, third trimester 07/20/2016  . Abnormal cervical Papanicolaou smear affecting pregnancy, antepartum 06/05/2016  . Asthma, mild intermittent 05/15/2016  . Supervision of normal pregnancy, antepartum 05/15/2016    PLAN: PTL stable Evaluate for discharge in am Continue procardia xl 30 daily  Anne Nguyen H 07/22/2016,3:38 PM

## 2016-07-23 MED ORDER — AZITHROMYCIN 250 MG PO TABS: 1000.0000 mg | ORAL_TABLET | Freq: Once | ORAL | Status: DC

## 2016-07-23 NOTE — Discharge Instructions (Signed)
Introduction Patient Name: ________________________________________________ Patient Due Date: ____________________ What is a fetal movement count? A fetal movement count is the number of times that you feel your baby move during a certain amount of time. This may also be called a fetal kick count. A fetal movement count is recommended for every pregnant woman. You may be asked to start counting fetal movements as early as week 28 of your pregnancy. Pay attention to when your baby is most active. You may notice your baby's sleep and wake cycles. You may also notice things that make your baby move more. You should do a fetal movement count:  When your baby is normally most active.  At the same time each day. A good time to count movements is while you are resting, after having something to eat and drink. How do I count fetal movements? 1. Find a quiet, comfortable area. Sit, or lie down on your side. 2. Write down the date, the start time and stop time, and the number of movements that you felt between those two times. Take this information with you to your health care visits. 3. For 2 hours, count kicks, flutters, swishes, rolls, and jabs. You should feel at least 10 movements during 2 hours. 4. You may stop counting after you have felt 10 movements. 5. If you do not feel 10 movements in 2 hours, have something to eat and drink. Then, keep resting and counting for 1 hour. If you feel at least 4 movements during that hour, you may stop counting. Contact a health care provider if:  You feel fewer than 4 movements in 2 hours.  Your baby is not moving like he or she usually does. Date: ____________ Start time: ____________ Stop time: ____________ Movements: ____________ Date: ____________ Start time: ____________ Stop time: ____________ Movements: ____________ Date: ____________ Start time: ____________ Stop time: ____________ Movements: ____________ Date: ____________ Start time: ____________  Stop time: ____________ Movements: ____________ Date: ____________ Start time: ____________ Stop time: ____________ Movements: ____________ Date: ____________ Start time: ____________ Stop time: ____________ Movements: ____________ Date: ____________ Start time: ____________ Stop time: ____________ Movements: ____________ Date: ____________ Start time: ____________ Stop time: ____________ Movements: ____________ Date: ____________ Start time: ____________ Stop time: ____________ Movements: ____________ This information is not intended to replace advice given to you by your health care provider. Make sure you discuss any questions you have with your health care provider. Document Released: 07/05/2006 Document Revised: 02/02/2016 Document Reviewed: 07/15/2015 Elsevier Interactive Patient Education  2017 [employer] Braxton Hicks Contractions Contractions of the uterus can occur throughout pregnancy. Contractions are not always a sign that you are in labor.  WHAT ARE BRAXTON HICKS CONTRACTIONS?  Contractions that occur before labor are called Braxton Hicks contractions, or false labor. Toward the end of pregnancy (32-34 weeks), these contractions can develop more often and may become more forceful. This is not true labor because these contractions do not result in opening (dilatation) and thinning of the cervix. They are sometimes difficult to tell apart from true labor because these contractions can be forceful and people have different pain tolerances. You should not feel embarrassed if you go to the hospital with false labor. Sometimes, the only way to tell if you are in true labor is for your health care provider to look for changes in the cervix. If there are no prenatal problems or other health problems associated with the pregnancy, it is completely safe to be sent home with false labor and await the onset of true labor.   HOW CAN YOU TELL THE DIFFERENCE BETWEEN TRUE AND FALSE LABOR? False Labor     The contractions of false labor are usually shorter and not as hard as those of true labor.   The contractions are usually irregular.   The contractions are often felt in the front of the lower abdomen and in the groin.   The contractions may go away when you walk around or change positions while lying down.   The contractions get weaker and are shorter lasting as time goes on.   The contractions do not usually become progressively stronger, regular, and closer together as with true labor.  True Labor   Contractions in true labor last 30-70 seconds, become very regular, usually become more intense, and increase in frequency.   The contractions do not go away with walking.   The discomfort is usually felt in the top of the uterus and spreads to the lower abdomen and low back.   True labor can be determined by your health care provider with an exam. This will show that the cervix is dilating and getting thinner.  WHAT TO REMEMBER  Keep up with your usual exercises and follow other instructions given by your health care provider.   Take medicines as directed by your health care provider.   Keep your regular prenatal appointments.   Eat and drink lightly if you think you are going into labor.   If Braxton Hicks contractions are making you uncomfortable:   Change your position from lying down or resting to walking, or from walking to resting.   Sit and rest in a tub of warm water.   Drink 2-3 glasses of water. Dehydration may cause these contractions.   Do slow and deep breathing several times an hour.  WHEN SHOULD I SEEK IMMEDIATE MEDICAL CARE? Seek immediate medical care if:  Your contractions become stronger, more regular, and closer together.   You have fluid leaking or gushing from your vagina.   You have a fever.   You pass blood-tinged mucus.   You have vaginal bleeding.   You have continuous abdominal pain.   You have low back pain  that you never had before.   You feel your baby's head pushing down and causing pelvic pressure.   Your baby is not moving as much as it used to.  This information is not intended to replace advice given to you by your health care provider. Make sure you discuss any questions you have with your health care provider. Document Released: 06/05/2005 Document Revised: 09/27/2015 Document Reviewed: 03/17/2013 Elsevier Interactive Patient Education  2017 [employer] Inc[employer]  

## 2016-07-23 NOTE — Discharge Summary (Signed)
Physician Discharge Summary  Patient ID: Anne Nguyen MRN: [id number] DOB/AGE: [birth year] 25 y.o.  Admit date: 07/20/2016 Discharge date: 07/23/2016  Admission Diagnoses:preterm labor  Discharge Diagnoses: threatened preterm labor Active Problems:   Threatened preterm labor, third trimester   Discharged Condition: good  Hospital Course: Anne Nguyen is a 26 y.o. female G3P1011 @34 .4 weeks by 6 wk [place] presenting for threatened PTL. She presents with ctx q15-20 min and lower pelvic pain. She was seen 2 days ago for similar sx but reports worsening of sx. She reports good FM. No VB or LOF. Evaluation in MAU reveals cervical change and ctx q2-6 min. Wet prep showed +trich. She received care in [place] initially then transferred care to [facility].           OB History    Gravida Para Term Preterm AB Living   3 1 1   1 1    SAB TAB Ectopic Multiple Live Births   1             Patient ID: 45 Anne Nguyen, female   DOB: Sep 18, 1990, 27 y.o.   MRN: [id number]  Pt resting; given Zithromax 1gm earlier; refused 2nd BMZ; family requesting cx exam  VSS, afeb Cx unchanged (3+/thick/high) Uterine irritability on monitor  IUP@34 .6wks Preterm cx change- stable +chlamydia  Reassured re no cervical change Discussed with boyfriend the need to him to be tx for Chlamydia and Trichomonas- questions answered  Anne Nguyen CNM 07/22/2016 1:22 AM  Consults: None  Significant Diagnostic Studies:  CBC    Component Value Date/Time   WBC 6.6 07/20/2016 1910   RBC 4.60 07/20/2016 1910   HGB 11.3 (L) 07/20/2016 1910   HCT 34.0 (L) 07/20/2016 1910   PLT 243 07/20/2016 1910   MCV 73.9 (L) 07/20/2016 1910   MCH 24.6 (L) 07/20/2016 1910   MCHC 33.2 07/20/2016 1910   RDW 14.5 07/20/2016 1910   LYMPHSABS 1.2 (L) 02/18/2009 0025   MONOABS 0.7 02/18/2009 0025   EOSABS 0.0 02/18/2009 0025   BASOSABS 0.0 02/18/2009 0025     Treatments: epidural  Discharge Exam: Blood pressure 116/69, pulse 81, temperature  98.1 F (36.7 C), temperature source Oral, resp. rate 18, height 5\' 5"  (1.651 m), weight 68.9 kg (152 lb), last menstrual period 11/14/2015, SpO2 100 %, unknown if currently breastfeeding. General appearance: alert, cooperative and no distress Pelvic: external genitalia normal and vagina normal without discharge Extremities: extremities normal, atraumatic, no cyanosis or edema Cervical dilation unchanged  Disposition: 01-Home or Self Care   Allergies as of 07/23/2016      Reactions   Ciprofloxacin Anaphylaxis, Other (See Comments)   Gets really hot.      Medication List    TAKE these medications   albuterol 108 (90 Base) MCG/ACT inhaler Commonly known as:  PROVENTIL HFA;VENTOLIN HFA Inhale 2 puffs into the lungs every 6 (six) hours as needed for wheezing or shortness of breath.   calcium carbonate 500 MG chewable tablet Commonly known as:  TUMS - dosed in mg elemental calcium Chew 2 tablets by mouth daily.   famotidine 20 MG tablet Commonly known as:  PEPCID Take 1 tablet (20 mg total) by mouth 2 (two) times daily.      Follow-up Information    [facility] Follow up in 1 day(s).   Specialty:  Obstetrics and Gynecology Contact information: [address] [place] [address] [phone]          Signed:  Scheryl DarterJames Arnold 07/23/2016, 8:01 AM

## 2016-07-23 NOTE — Progress Notes (Signed)
Pt reports pain @ epidural insertion site.  Dressing dry & intact.

## 2016-07-24 ENCOUNTER — Ambulatory Visit (INDEPENDENT_AMBULATORY_CARE_PROVIDER_SITE_OTHER): Payer: Medicaid Other | Admitting: Family Medicine

## 2016-07-24 ENCOUNTER — Other Ambulatory Visit (HOSPITAL_COMMUNITY)
Admission: RE | Admit: 2016-07-24 | Discharge: 2016-07-24 | Disposition: A | Discharge status: Home / Self Care | Payer: Medicaid Other | Source: Ambulatory Visit | Attending: Family Medicine | Admitting: Family Medicine

## 2016-07-24 VITALS — BP 129/77 | HR 110 | Wt 152.0 lb

## 2016-07-24 DIAGNOSIS — O4703 False labor before 37 completed weeks of gestation, third trimester: Secondary | ICD-10-CM

## 2016-07-24 DIAGNOSIS — Z3400 Encounter for supervision of normal first pregnancy, unspecified trimester: Secondary | ICD-10-CM

## 2016-07-24 DIAGNOSIS — Z113 Encounter for screening for infections with a predominantly sexual mode of transmission: Secondary | ICD-10-CM | POA: Diagnosis not present

## 2016-07-24 NOTE — Progress Notes (Signed)
Patient states that she is having contraction again that started at 3 am. Patient was recently hospitalized for preterm labor. Armandina StammerJennifer Howard RN BSN

## 2016-07-24 NOTE — Progress Notes (Signed)
   PRENATAL VISIT NOTE  Subjective:  Anne Nguyen is a 26 y.o. G3P1011 at 959w1d being seen today for ongoing prenatal care.  She is currently monitored for the following issues for this low-risk pregnancy and has Asthma, mild intermittent; Supervision of normal pregnancy, antepartum; Abnormal cervical Papanicolaou smear affecting pregnancy, antepartum; and Threatened preterm labor, third trimester on her problem list.  Patient reports being hospitalized for threatened preterm labor. Had BMZ. Contractions stopped and was d/c'd to home. Dilated to 3cm. Still having contractions - had ctxs last night, lasted several hours. Ctxs 10 minutes apart..  Contractions: Irregular. Vag. Bleeding: None.  Movement: Present. Denies leaking of fluid.   The following portions of the patient's history were reviewed and updated as appropriate: allergies, current medications, past family history, past medical history, past social history, past surgical history and problem list. Problem list updated.  Objective:   Vitals:   07/24/16 0842  BP: 129/77  Pulse: (!) 110  Weight: 152 lb (68.9 kg)    Fetal Status: Fetal Heart Rate (bpm): 135 Fundal Height: 35 cm Movement: Present  Presentation: Vertex  General:  Alert, oriented and cooperative. Patient is in no acute distress.  Skin: Skin is warm and dry. No rash noted.   Cardiovascular: Normal heart rate noted  Respiratory: Normal respiratory effort, no problems with respiration noted  Abdomen: Soft, gravid, appropriate for gestational age. Pain/Pressure: Present     Pelvic:  Cervical exam preformed. Dilation: 3 Effacement (%): 50 Station: -3  Extremities: Normal range of motion.  Edema: None  Mental Status: Normal mood and affect. Normal behavior. Normal judgment and thought content.   Assessment and Plan:  Pregnancy: G3P1011 at 7059w1d  1. Supervision of normal first pregnancy, antepartum FHT and FH normal. US for growth as had low percentages with last  US.  2. Threatened preterm labor, third trimester Removed from work as has to walk quite a bit and having worsening ctxs with ambulation. Gc/CT done today.  Preterm labor symptoms and general obstetric precautions including but not limited to vaginal bleeding, contractions, leaking of fluid and fetal movement were reviewed in detail with the patient. Please refer to After Visit Summary for other counseling recommendations.  No Follow-up on file.   Levie HeritageJacob J Williemae Muriel, DO

## 2016-07-25 LAB — GC/CHLAMYDIA PROBE AMP (~~LOC~~) NOT AT ARMC
Chlamydia: POSITIVE — AB
Neisseria Gonorrhea: NEGATIVE

## 2016-07-26 ENCOUNTER — Ambulatory Visit (HOSPITAL_COMMUNITY)
Admission: RE | Admit: 2016-07-26 | Discharge: 2016-07-26 | Disposition: A | Discharge status: Home / Self Care | Payer: Medicaid Other | Source: Ambulatory Visit | Attending: Family Medicine | Admitting: Family Medicine

## 2016-07-26 ENCOUNTER — Other Ambulatory Visit (HOSPITAL_COMMUNITY): Payer: Self-pay | Admitting: Family Medicine

## 2016-07-26 DIAGNOSIS — O09293 Supervision of pregnancy with other poor reproductive or obstetric history, third trimester: Secondary | ICD-10-CM | POA: Diagnosis not present

## 2016-07-26 DIAGNOSIS — Z8759 Personal history of other complications of pregnancy, childbirth and the puerperium: Secondary | ICD-10-CM

## 2016-07-26 DIAGNOSIS — Z3689 Encounter for other specified antenatal screening: Secondary | ICD-10-CM | POA: Insufficient documentation

## 2016-07-26 DIAGNOSIS — Z3A35 35 weeks gestation of pregnancy: Secondary | ICD-10-CM | POA: Diagnosis not present

## 2016-07-27 ENCOUNTER — Telehealth: Payer: Self-pay

## 2016-07-27 NOTE — Telephone Encounter (Signed)
Patient called and made aware that we had sent in another gc/chl only 4 days after she was tested in the hospital so her Chl was still positive. Patient confirms that she did take [facility]. I assured her we would re send another sample in about two weeks to make sure she is negative. Discuss with patient making sure abstain from intercourse until partner tested and treated if needed. Patient states she move here from [place] in Sept and has not been sexually active since then.  Anne StammerJennifer Dachelle Nguyen RNBSN

## 2016-07-31 ENCOUNTER — Ambulatory Visit (INDEPENDENT_AMBULATORY_CARE_PROVIDER_SITE_OTHER): Payer: Medicaid Other | Admitting: Family Medicine

## 2016-07-31 VITALS — BP 120/70 | HR 81 | Wt 155.0 lb

## 2016-07-31 DIAGNOSIS — A749 Chlamydial infection, unspecified: Secondary | ICD-10-CM

## 2016-07-31 DIAGNOSIS — O98313 Other infections with a predominantly sexual mode of transmission complicating pregnancy, third trimester: Secondary | ICD-10-CM

## 2016-07-31 DIAGNOSIS — O98819 Other maternal infectious and parasitic diseases complicating pregnancy, unspecified trimester: Secondary | ICD-10-CM

## 2016-07-31 DIAGNOSIS — Z3400 Encounter for supervision of normal first pregnancy, unspecified trimester: Secondary | ICD-10-CM

## 2016-07-31 NOTE — Progress Notes (Signed)
   PRENATAL VISIT NOTE  Subjective:  Anne Nguyen is a 26 y.o. G3P1011 at 6251w1d being seen today for ongoing prenatal care.  She is currently monitored for the following issues for this low-risk pregnancy and has Asthma, mild intermittent; Supervision of normal pregnancy, antepartum; Abnormal cervical Papanicolaou smear affecting pregnancy, antepartum; and Threatened preterm labor, third trimester on her problem list.  Patient reports occasional contractions.  Contractions: Irritability. Vag. Bleeding: None.  Movement: Present. Denies leaking of fluid.   The following portions of the patient's history were reviewed and updated as appropriate: allergies, current medications, past family history, past medical history, past social history, past surgical history and problem list. Problem list updated.  Objective:   Vitals:   07/31/16 0934  BP: 120/70  Pulse: 81  Weight: 155 lb (70.3 kg)    Fetal Status: Fetal Heart Rate (bpm): 145   Movement: Present     General:  Alert, oriented and cooperative. Patient is in no acute distress.  Skin: Skin is warm and dry. No rash noted.   Cardiovascular: Normal heart rate noted  Respiratory: Normal respiratory effort, no problems with respiration noted  Abdomen: Soft, gravid, appropriate for gestational age. Pain/Pressure: Present     Pelvic:  Cervical exam deferred        Extremities: Normal range of motion.  Edema: None  Mental Status: Normal mood and affect. Normal behavior. Normal judgment and thought content.   Assessment and Plan:  Pregnancy: G3P1011 at 8151w1d  1. Supervision of normal first pregnancy, antepartum FHT and FH normal  2. Chlamydia infection affecting pregnancy, antepartum Needs TOC beginning March   Preterm labor symptoms and general obstetric precautions including but not limited to vaginal bleeding, contractions, leaking of fluid and fetal movement were reviewed in detail with the patient. Please refer to After Visit Summary  for other counseling recommendations.  No Follow-up on file.   Levie HeritageJacob J Constance Whittle, DO

## 2016-08-03 ENCOUNTER — Telehealth: Payer: Self-pay

## 2016-08-03 NOTE — Telephone Encounter (Signed)
Ob pt Having a lot of pressure.

## 2016-08-03 NOTE — Telephone Encounter (Signed)
Patient advised to go to the hospital per our email earlier this week. Armandina StammerJennifer Howard RNBSN

## 2016-08-07 ENCOUNTER — Ambulatory Visit (INDEPENDENT_AMBULATORY_CARE_PROVIDER_SITE_OTHER): Payer: Medicaid Other | Admitting: Family Medicine

## 2016-08-07 VITALS — BP 111/69 | HR 107 | Wt 156.0 lb

## 2016-08-07 DIAGNOSIS — Z3400 Encounter for supervision of normal first pregnancy, unspecified trimester: Secondary | ICD-10-CM

## 2016-08-07 DIAGNOSIS — Z3403 Encounter for supervision of normal first pregnancy, third trimester: Secondary | ICD-10-CM

## 2016-08-07 NOTE — Progress Notes (Signed)
   PRENATAL VISIT NOTE  Subjective:  Anne Nguyen is a 26 y.o. G3P1011 at 7081w1d being seen today for ongoing prenatal care.  She is currently monitored for the following issues for this low-risk pregnancy and has Asthma, mild intermittent; Supervision of normal pregnancy, antepartum; Abnormal cervical Papanicolaou smear affecting pregnancy, antepartum; Threatened preterm labor, third trimester; and Chlamydia infection affecting pregnancy, antepartum on her problem list.  Patient reports pelvic pressure.  Contractions: Irritability. Vag. Bleeding: None.  Movement: Present. Denies leaking of fluid.   The following portions of the patient's history were reviewed and updated as appropriate: allergies, current medications, past family history, past medical history, past social history, past surgical history and problem list. Problem list updated.  Objective:   Vitals:   08/07/16 1004  BP: 111/69  Pulse: (!) 107  Weight: 156 lb (70.8 kg)    Fetal Status: Fetal Heart Rate (bpm): 143 Fundal Height: 37 cm Movement: Present  Presentation: Vertex  General:  Alert, oriented and cooperative. Patient is in no acute distress.  Skin: Skin is warm and dry. No rash noted.   Cardiovascular: Normal heart rate noted  Respiratory: Normal respiratory effort, no problems with respiration noted  Abdomen: Soft, gravid, appropriate for gestational age. Pain/Pressure: Present     Pelvic:  Cervical exam performed Dilation: 3 Effacement (%): 50 Station: -3  Extremities: Normal range of motion.  Edema: None  Mental Status: Normal mood and affect. Normal behavior. Normal judgment and thought content.   Assessment and Plan:  Pregnancy: G3P1011 at 7381w1d  1. Supervision of normal first pregnancy, antepartum FHT and FH normal.  Term labor symptoms and general obstetric precautions including but not limited to vaginal bleeding, contractions, leaking of fluid and fetal movement were reviewed in detail with the  patient. Please refer to After Visit Summary for other counseling recommendations.  No Follow-up on file.   Levie HeritageJacob J Lachelle Rissler, DO

## 2016-08-14 ENCOUNTER — Ambulatory Visit (INDEPENDENT_AMBULATORY_CARE_PROVIDER_SITE_OTHER): Payer: Medicaid Other | Admitting: Family Medicine

## 2016-08-14 DIAGNOSIS — Z3493 Encounter for supervision of normal pregnancy, unspecified, third trimester: Secondary | ICD-10-CM

## 2016-08-14 DIAGNOSIS — Z3490 Encounter for supervision of normal pregnancy, unspecified, unspecified trimester: Secondary | ICD-10-CM

## 2016-08-14 NOTE — Progress Notes (Signed)
   PRENATAL VISIT NOTE  Subjective:  Anne Nguyen is a 26 y.o. G3P1011 at 8199w1d being seen today for ongoing prenatal care.  She is currently monitored for the following issues for this low-risk pregnancy and has Asthma, mild intermittent; Supervision of normal pregnancy, antepartum; Abnormal cervical Papanicolaou smear affecting pregnancy, antepartum; Threatened preterm labor, third trimester; and Chlamydia infection affecting pregnancy, antepartum on her problem list.  Patient reports occasional contractions.  Contractions: Irregular. Vag. Bleeding: None.  Movement: Present. Denies leaking of fluid.   The following portions of the patient's history were reviewed and updated as appropriate: allergies, current medications, past family history, past medical history, past social history, past surgical history and problem list. Problem list updated.  Objective:   Vitals:   08/14/16 0846  BP: 130/76  Pulse: 87  Weight: 159 lb (72.1 kg)    Fetal Status: Fetal Heart Rate (bpm): 129 Fundal Height: 37 cm Movement: Present  Presentation: Vertex  General:  Alert, oriented and cooperative. Patient is in no acute distress.  Skin: Skin is warm and dry. No rash noted.   Cardiovascular: Normal heart rate noted  Respiratory: Normal respiratory effort, no problems with respiration noted  Abdomen: Soft, gravid, appropriate for gestational age. Pain/Pressure: Present     Pelvic:  Cervical exam deferred Dilation: 3 Effacement (%): 50 Station: -3  Extremities: Normal range of motion.  Edema: None  Mental Status: Normal mood and affect. Normal behavior. Normal judgment and thought content.   Assessment and Plan:  Pregnancy: G3P1011 at 5299w1d  1. Encounter for supervision of normal pregnancy, antepartum, unspecified gravidity Fht and FH normal  Term labor symptoms and general obstetric precautions including but not limited to vaginal bleeding, contractions, leaking of fluid and fetal movement were reviewed  in detail with the patient. Please refer to After Visit Summary for other counseling recommendations.  No Follow-up on file.   Levie HeritageJacob J Stinson, DO

## 2016-08-15 ENCOUNTER — Encounter (HOSPITAL_COMMUNITY): Payer: Self-pay

## 2016-08-15 ENCOUNTER — Inpatient Hospital Stay (HOSPITAL_COMMUNITY)
Admission: AD | Admit: 2016-08-15 | Discharge: 2016-08-15 | Disposition: A | Discharge status: Home / Self Care | Payer: Medicaid Other | Source: Ambulatory Visit | Attending: Family Medicine | Admitting: Family Medicine

## 2016-08-15 DIAGNOSIS — O479 False labor, unspecified: Secondary | ICD-10-CM

## 2016-08-15 DIAGNOSIS — Z3A00 Weeks of gestation of pregnancy not specified: Secondary | ICD-10-CM | POA: Diagnosis not present

## 2016-08-15 NOTE — MAU Note (Signed)
I have communicated with Dr. Earlene PlaterWallace and reviewed vital signs:  Vitals:   08/15/16 1744 08/15/16 1938  BP: 138/86 125/77  Pulse: 114 107  Resp: 18 18  Temp: 98 F (36.7 C)     Vaginal exam:  Dilation: 4 Effacement (%): 80 Cervical Position: Middle Station: -2 Presentation: Vertex Exam by:: Kayren EavesAshley Jobany Montellano rn ,   Also reviewed contraction pattern and that non-stress test is reactive.  It has been documented that patient is contracting every 9-12 minutes with no cervical change over 1 hour not indicating active labor.  Patient denies any other complaints.  Based on this report provider has given order for discharge.  A discharge diagnosis will be entered by a provider.  Labor discharge instructions reviewed with patient.

## 2016-08-15 NOTE — Discharge Instructions (Signed)
Braxton Hicks Contractions °Contractions of the uterus can occur throughout pregnancy, but they are not always a sign that you are in labor. You may have practice contractions called Braxton Hicks contractions. These false labor contractions are sometimes confused with true labor. °What are Braxton Hicks contractions? °Braxton Hicks contractions are tightening movements that occur in the muscles of the uterus before labor. Unlike true labor contractions, these contractions do not result in opening (dilation) and thinning of the cervix. Toward the end of pregnancy (32-34 weeks), Braxton Hicks contractions can happen more often and may become stronger. These contractions are sometimes difficult to tell apart from true labor because they can be very uncomfortable. You should not feel embarrassed if you go to the hospital with false labor. °Sometimes, the only way to tell if you are in true labor is for your health care provider to look for changes in the cervix. The health care provider will do a physical exam and may monitor your contractions. If you are not in true labor, the exam should show that your cervix is not dilating and your water has not broken. °If there are no prenatal problems or other health problems associated with your pregnancy, it is completely safe for you to be sent home with false labor. You may continue to have Braxton Hicks contractions until you go into true labor. °How can I tell the difference between true labor and false labor? °· Differences °¨ False labor °¨ Contractions last 30-70 seconds.: Contractions are usually shorter and not as strong as true labor contractions. °¨ Contractions become very regular.: Contractions are usually irregular. °¨ Discomfort is usually felt in the top of the uterus, and it spreads to the lower abdomen and low back.: Contractions are often felt in the front of the lower abdomen and in the groin. °¨ Contractions do not go away with walking.: Contractions may  go away when you walk around or change positions while lying down. °¨ Contractions usually become more intense and increase in frequency.: Contractions get weaker and are shorter-lasting as time goes on. °¨ The cervix dilates and gets thinner.: The cervix usually does not dilate or become thin. °Follow these instructions at home: °¨ Take over-the-counter and prescription medicines only as told by your health care provider. °¨ Keep up with your usual exercises and follow other instructions from your health care provider. °¨ Eat and drink lightly if you think you are going into labor. °¨ If Braxton Hicks contractions are making you uncomfortable: °¨ Change your position from lying down or resting to walking, or change from walking to resting. °¨ Sit and rest in a tub of warm water. °¨ Drink enough fluid to keep your urine clear or pale yellow. Dehydration may cause these contractions. °¨ Do slow and deep breathing several times an hour. °¨ Keep all follow-up prenatal visits as told by your health care provider. This is important. °Contact a health care provider if: °¨ You have a fever. °¨ You have continuous pain in your abdomen. °Get help right away if: °¨ Your contractions become stronger, more regular, and closer together. °¨ You have fluid leaking or gushing from your vagina. °¨ You pass blood-tinged mucus (bloody show). °¨ You have bleeding from your vagina. °¨ You have low back pain that you never had before. °¨ You feel your baby’s head pushing down and causing pelvic pressure. °¨ Your baby is not moving inside you as much as it used to. °Summary °¨ Contractions that occur before labor are   called Braxton Hicks contractions, false labor, or practice contractions. °¨ Braxton Hicks contractions are usually shorter, weaker, farther apart, and less regular than true labor contractions. True labor contractions usually become progressively stronger and regular and they become more frequent. °¨ Manage discomfort from  Braxton Hicks contractions by changing position, resting in a warm bath, drinking plenty of water, or practicing deep breathing. °This information is not intended to replace advice given to you by your health care provider. Make sure you discuss any questions you have with your health care provider. °Document Released: 06/05/2005 Document Revised: 04/24/2016 Document Reviewed: 04/24/2016 °Elsevier Interactive Patient Education © 2017 [employer] °Fetal Movement Counts °Patient Name: ________________________________________________ Patient Due Date: ____________________ °What is a fetal movement count? °A fetal movement count is the number of times that you feel your baby move during a certain amount of time. This may also be called a fetal kick count. A fetal movement count is recommended for every pregnant woman. You may be asked to start counting fetal movements as early as week 28 of your pregnancy. °Pay attention to when your baby is most active. You may notice your baby's sleep and wake cycles. You may also notice things that make your baby move more. You should do a fetal movement count: °· When your baby is normally most active. °· At the same time each day. °A good time to count movements is while you are resting, after having something to eat and drink. °How do I count fetal movements? °1. Find a quiet, comfortable area. Sit, or lie down on your side. °2. Write down the date, the start time and stop time, and the number of movements that you felt between those two times. Take this information with you to your health care visits. °3. For 2 hours, count kicks, flutters, swishes, rolls, and jabs. You should feel at least 10 movements during 2 hours. °4. You may stop counting after you have felt 10 movements. °5. If you do not feel 10 movements in 2 hours, have something to eat and drink. Then, keep resting and counting for 1 hour. If you feel at least 4 movements during that hour, you may stop  counting. °Contact a health care provider if: °· You feel fewer than 4 movements in 2 hours. °· Your baby is not moving like he or she usually does. °Date: ____________ Start time: ____________ Stop time: ____________ Movements: ____________ °Date: ____________ Start time: ____________ Stop time: ____________ Movements: ____________ °Date: ____________ Start time: ____________ Stop time: ____________ Movements: ____________ °Date: ____________ Start time: ____________ Stop time: ____________ Movements: ____________ °Date: ____________ Start time: ____________ Stop time: ____________ Movements: ____________ °Date: ____________ Start time: ____________ Stop time: ____________ Movements: ____________ °Date: ____________ Start time: ____________ Stop time: ____________ Movements: ____________ °Date: ____________ Start time: ____________ Stop time: ____________ Movements: ____________ °Date: ____________ Start time: ____________ Stop time: ____________ Movements: ____________ °This information is not intended to replace advice given to you by your health care provider. Make sure you discuss any questions you have with your health care provider. °Document Released: 07/05/2006 Document Revised: 02/02/2016 Document Reviewed: 07/15/2015 °Elsevier Interactive Patient Education © 2017 [employer] ° °

## 2016-08-15 NOTE — MAU Note (Signed)
Contractions started about noon, have gotten a little stronger. Feeling pelvic pressure. No bleeding or leaking.  On Monday was 3-4cm.

## 2016-08-16 ENCOUNTER — Telehealth: Payer: Self-pay

## 2016-08-16 NOTE — Telephone Encounter (Signed)
Patient called stating she was at hospital last night and having contractions. They sent her home because there was no cervical change and she was still 4 cm dilated. Patient complaining of the irregular contractions and the pain with them.  Informed patient to try and rest as much as possible and increase her water intake. She couldn't give me a definative amount of water she drinks a day. I informed her dehydration can make braxton hicks contractions worse. We reviewed that if her contractions started coming every 5-10 minutes apart she could return to hospital for evaluation or if she started leaking fluid. Also to return if baby had any decrease in movement. Patient states understanding and confirms baby is moving well. Armandina StammerJennifer Howard RN BSM

## 2016-08-23 ENCOUNTER — Ambulatory Visit (INDEPENDENT_AMBULATORY_CARE_PROVIDER_SITE_OTHER): Payer: Medicaid Other | Admitting: Obstetrics & Gynecology

## 2016-08-23 ENCOUNTER — Other Ambulatory Visit (HOSPITAL_COMMUNITY)
Admission: RE | Admit: 2016-08-23 | Discharge: 2016-08-23 | Disposition: A | Discharge status: Home / Self Care | Payer: Medicaid Other | Source: Ambulatory Visit | Attending: Obstetrics & Gynecology | Admitting: Obstetrics & Gynecology

## 2016-08-23 VITALS — BP 118/73 | HR 100 | Wt 163.0 lb

## 2016-08-23 DIAGNOSIS — O98313 Other infections with a predominantly sexual mode of transmission complicating pregnancy, third trimester: Secondary | ICD-10-CM

## 2016-08-23 DIAGNOSIS — Z113 Encounter for screening for infections with a predominantly sexual mode of transmission: Secondary | ICD-10-CM | POA: Insufficient documentation

## 2016-08-23 DIAGNOSIS — Z3480 Encounter for supervision of other normal pregnancy, unspecified trimester: Secondary | ICD-10-CM

## 2016-08-23 DIAGNOSIS — O98819 Other maternal infectious and parasitic diseases complicating pregnancy, unspecified trimester: Secondary | ICD-10-CM

## 2016-08-23 DIAGNOSIS — Z3483 Encounter for supervision of other normal pregnancy, third trimester: Secondary | ICD-10-CM

## 2016-08-23 DIAGNOSIS — A749 Chlamydial infection, unspecified: Secondary | ICD-10-CM

## 2016-08-23 MED ORDER — BREAST PUMP MISC: 0 refills | Status: DC

## 2016-08-23 NOTE — Progress Notes (Signed)
   PRENATAL VISIT NOTE  Subjective:  Anne Nguyen is a 26 y.o. S AA G3P1011 at 2734w3d being seen today for ongoing prenatal care.  She is currently monitored for the following issues for this low-risk pregnancy and has Asthma, mild intermittent; Supervision of normal pregnancy, antepartum; Abnormal cervical Papanicolaou smear affecting pregnancy, antepartum; Threatened preterm labor, third trimester; and Chlamydia infection affecting pregnancy, antepartum on her problem list.  Patient reports very tired of being pregnant..  Contractions: Irregular. Vag. Bleeding: None.  Movement: Present. Denies leaking of fluid.   The following portions of the patient's history were reviewed and updated as appropriate: allergies, current medications, past family history, past medical history, past social history, past surgical history and problem list. Problem list updated.  Objective:   Vitals:   08/23/16 1257  BP: 118/73  Pulse: 100  Weight: 163 lb (73.9 kg)    Fetal Status: Fetal Heart Rate (bpm): 137   Movement: Present     General:  Alert, oriented and cooperative. Patient is in no acute distress.  Skin: Skin is warm and dry. No rash noted.   Cardiovascular: Normal heart rate noted  Respiratory: Normal respiratory effort, no problems with respiration noted  Abdomen: Soft, gravid, appropriate for gestational age. Pain/Pressure: Present     Pelvic:  Cervical exam performed        Extremities: Normal range of motion.  Edema: None  Mental Status: Normal mood and affect. Normal behavior. Normal judgment and thought content.   Assessment and Plan:  Pregnancy: G3P1011 at 9834w3d  1. Supervision of other normal pregnancy, antepartum   2. Chlamydia infection affecting pregnancy, antepartum  - [facility]  Term labor symptoms and general obstetric precautions including but not limited to vaginal bleeding, contractions, leaking of fluid and fetal movement were  reviewed in detail with the patient. Please refer to After Visit Summary for other counseling recommendations.  No Follow-up on file.   Allie BossierMyra C Eris Breck, MD

## 2016-08-23 NOTE — Progress Notes (Signed)
Pt is complaining of "back spasms"

## 2016-08-24 ENCOUNTER — Encounter (HOSPITAL_COMMUNITY): Payer: Self-pay

## 2016-08-24 ENCOUNTER — Inpatient Hospital Stay (HOSPITAL_COMMUNITY)
Admission: AD | Admit: 2016-08-24 | Discharge: 2016-08-24 | Disposition: A | Discharge status: Home / Self Care | Payer: Medicaid Other | Source: Ambulatory Visit | Attending: Obstetrics and Gynecology | Admitting: Obstetrics and Gynecology

## 2016-08-24 DIAGNOSIS — Z3480 Encounter for supervision of other normal pregnancy, unspecified trimester: Secondary | ICD-10-CM

## 2016-08-24 DIAGNOSIS — O479 False labor, unspecified: Secondary | ICD-10-CM

## 2016-08-24 DIAGNOSIS — R87619 Unspecified abnormal cytological findings in specimens from cervix uteri: Secondary | ICD-10-CM

## 2016-08-24 DIAGNOSIS — O3440 Maternal care for other abnormalities of cervix, unspecified trimester: Secondary | ICD-10-CM

## 2016-08-24 LAB — GC/CHLAMYDIA PROBE AMP (~~LOC~~) NOT AT ARMC
Chlamydia: NEGATIVE
Neisseria Gonorrhea: NEGATIVE

## 2016-08-24 NOTE — MAU Note (Signed)
I have communicated with Janan RidgeFran Crezenso-Dishmon CNM and reviewed vital signs:  Vitals:   08/24/16 2156  BP: 130/85  Pulse: 106  Resp: 18  Temp: 98 F (36.7 C)    Vaginal exam:  Dilation: 4.5 Effacement (%): 60 Cervical Position: Middle Station: -2 Presentation: Vertex Exam by:: Druscilla Brownie. [facility],   Also reviewed contraction pattern and that non-stress test is reactive.  It has been documented that patient is contracting every 7-12 minutes with no cervical change over 2.5 hours not indicating active labor.  Patient denies any other complaints.  Based on this report provider has given order for discharge.  A discharge order and diagnosis entered by a provider.   Labor discharge instructions reviewed with patient.

## 2016-08-24 NOTE — MAU Note (Signed)
Pt and family informed of discharge and became upset. Reviewed labor precautions with patient and explained reasons for discharge. Patient not wanting to leave and requesting to speak with midwife. CNM informed and will come see patient.

## 2016-08-24 NOTE — MAU Note (Signed)
Pt presents complaining of contractions every 10 minutes. Denies leaking or bleeding. Reports good fetal movement. 4cm in office yesterday.

## 2016-08-24 NOTE — Discharge Instructions (Signed)
Fetal Movement Counts °Patient Name: ________________________________________________ Patient Due Date: ____________________ °What is a fetal movement count? °A fetal movement count is the number of times that you feel your baby move during a certain amount of time. This may also be called a fetal kick count. A fetal movement count is recommended for every pregnant woman. You may be asked to start counting fetal movements as early as week 28 of your pregnancy. °Pay attention to when your baby is most active. You may notice your baby's sleep and wake cycles. You may also notice things that make your baby move more. You should do a fetal movement count: °· When your baby is normally most active. °· At the same time each day. °A good time to count movements is while you are resting, after having something to eat and drink. °How do I count fetal movements? °1. Find a quiet, comfortable area. Sit, or lie down on your side. °2. Write down the date, the start time and stop time, and the number of movements that you felt between those two times. Take this information with you to your health care visits. °3. For 2 hours, count kicks, flutters, swishes, rolls, and jabs. You should feel at least 10 movements during 2 hours. °4. You may stop counting after you have felt 10 movements. °5. If you do not feel 10 movements in 2 hours, have something to eat and drink. Then, keep resting and counting for 1 hour. If you feel at least 4 movements during that hour, you may stop counting. °Contact a health care provider if: °· You feel fewer than 4 movements in 2 hours. °· Your baby is not moving like he or she usually does. °Date: ____________ Start time: ____________ Stop time: ____________ Movements: ____________ °Date: ____________ Start time: ____________ Stop time: ____________ Movements: ____________ °Date: ____________ Start time: ____________ Stop time: ____________ Movements: ____________ °Date: ____________ Start time:  ____________ Stop time: ____________ Movements: ____________ °Date: ____________ Start time: ____________ Stop time: ____________ Movements: ____________ °Date: ____________ Start time: ____________ Stop time: ____________ Movements: ____________ °Date: ____________ Start time: ____________ Stop time: ____________ Movements: ____________ °Date: ____________ Start time: ____________ Stop time: ____________ Movements: ____________ °Date: ____________ Start time: ____________ Stop time: ____________ Movements: ____________ °This information is not intended to replace advice given to you by your health care provider. Make sure you discuss any questions you have with your health care provider. °Document Released: 07/05/2006 Document Revised: 02/02/2016 Document Reviewed: 07/15/2015 °Elsevier Interactive Patient Education © 2017 [employer] ° °Third Trimester of Pregnancy °The third trimester is from week 28 through week 40 (months 7 through 9). The third trimester is a time when the unborn baby (fetus) is growing rapidly. At the end of the ninth month, the fetus is about 20 inches in length and weighs 6-10 pounds. °Body changes during your third trimester °Your body will continue to go through many changes during pregnancy. The changes vary from woman to woman. During the third trimester: °· Your weight will continue to increase. You can expect to gain 25-35 pounds (11-16 kg) by the end of the pregnancy. °· You may begin to get stretch marks on your hips, abdomen, and breasts. °· You may urinate more often because the fetus is moving lower into your pelvis and pressing on your bladder. °· You may develop or continue to have heartburn. This is caused by increased hormones that slow down muscles in the digestive tract. °· You may develop or continue to have constipation because increased   hormones slow digestion and cause the muscles that push waste through your intestines to relax. °· You may develop hemorrhoids. These  are swollen veins (varicose veins) in the rectum that can itch or be painful. °· You may develop swollen, bulging veins (varicose veins) in your legs. °· You may have increased body aches in the pelvis, back, or thighs. This is due to weight gain and increased hormones that are relaxing your joints. °· You may have changes in your hair. These can include thickening of your hair, rapid growth, and changes in texture. Some women also have hair loss during or after pregnancy, or hair that feels dry or thin. Your hair will most likely return to normal after your baby is born. °· Your breasts will continue to grow and they will continue to become tender. A yellow fluid (colostrum) may leak from your breasts. This is the first milk you are producing for your baby. °· Your belly button may stick out. °· You may notice more swelling in your hands, face, or ankles. °· You may have increased tingling or numbness in your hands, arms, and legs. The skin on your belly may also feel numb. °· You may feel short of breath because of your expanding uterus. °· You may have more problems sleeping. This can be caused by the size of your belly, increased need to urinate, and an increase in your body's metabolism. °· You may notice the fetus "dropping," or moving lower in your abdomen (lightening). °· You may have increased vaginal discharge. °· You may notice your joints feel loose and you may have pain around your pelvic bone. °What to expect at prenatal visits °You will have prenatal exams every 2 weeks until week 36. Then you will have weekly prenatal exams. During a routine prenatal visit: °· You will be weighed to make sure you and the baby are growing normally. °· Your blood pressure will be taken. °· Your abdomen will be measured to track your baby's growth. °· The fetal heartbeat will be listened to. °· Any test results from the previous visit will be discussed. °· You may have a cervical check near your due date to see if your  cervix has softened or thinned (effaced). °· You will be tested for Group B streptococcus. This happens between 35 and 37 weeks. °Your health care provider may ask you: °· What your birth plan is. °· How you are feeling. °· If you are feeling the baby move. °· If you have had any abnormal symptoms, such as leaking fluid, bleeding, severe headaches, or abdominal cramping. °· If you are using any tobacco products, including cigarettes, chewing tobacco, and electronic cigarettes. °· If you have any questions. °Other tests or screenings that may be performed during your third trimester include: °· Blood tests that check for low iron levels (anemia). °· Fetal testing to check the health, activity level, and growth of the fetus. Testing is done if you have certain medical conditions or if there are problems during the pregnancy. °· Nonstress test (NST). This test checks the health of your baby to make sure there are no signs of problems, such as the baby not getting enough oxygen. During this test, a belt is placed around your belly. The baby is made to move, and its heart rate is monitored during movement. °What is false labor? °False labor is a condition in which you feel small, irregular tightenings of the muscles in the womb (contractions) that usually go away with rest,   changing position, or drinking water. These are called Braxton Hicks contractions. Contractions may last for hours, days, or even weeks before true labor sets in. If contractions come at regular intervals, become more frequent, increase in intensity, or become painful, you should see your health care provider. °What are the signs of labor? °· Abdominal cramps. °· Regular contractions that start at 10 minutes apart and become stronger and more frequent with time. °· Contractions that start on the top of the uterus and spread down to the lower abdomen and back. °· Increased pelvic pressure and dull back pain. °· A watery or bloody mucus discharge that  comes from the vagina. °· Leaking of amniotic fluid. This is also known as your "water breaking." It could be a slow trickle or a gush. Let your health care provider know if it has a color or strange odor. °If you have any of these signs, call your health care provider right away, even if it is before your due date. °Follow these instructions at home: °Medicines  °· Follow your health care provider's instructions regarding medicine use. Specific medicines may be either safe or unsafe to take during pregnancy. °· Take a prenatal vitamin that contains at least 600 micrograms (mcg) of folic acid. °· If you develop constipation, try taking a stool softener if your health care provider approves. °Eating and drinking  °· Eat a balanced diet that includes fresh fruits and vegetables, whole grains, good sources of protein such as meat, eggs, or tofu, and low-fat dairy. Your health care provider will help you determine the amount of weight gain that is right for you. °· Avoid raw meat and uncooked cheese. These carry germs that can cause birth defects in the baby. °· If you have low calcium intake from food, talk to your health care provider about whether you should take a daily calcium supplement. °· Eat four or five small meals rather than three large meals a day. °· Limit foods that are high in fat and processed sugars, such as fried and sweet foods. °· To prevent constipation: °¨ Drink enough fluid to keep your urine clear or pale yellow. °¨ Eat foods that are high in fiber, such as fresh fruits and vegetables, whole grains, and beans. °Activity  °· Exercise only as directed by your health care provider. Most women can continue their usual exercise routine during pregnancy. Try to exercise for 30 minutes at least 5 days a week. Stop exercising if you experience uterine contractions. °· Avoid heavy lifting. °· Do not exercise in extreme heat or humidity, or at high altitudes. °· Wear low-heel, comfortable  shoes. °· Practice good posture. °· You may continue to have sex unless your health care provider tells you otherwise. °Relieving pain and discomfort  °· Take frequent breaks and rest with your legs elevated if you have leg cramps or low back pain. °· Take warm sitz baths to soothe any pain or discomfort caused by hemorrhoids. Use hemorrhoid cream if your health care provider approves. °· Wear a good support bra to prevent discomfort from breast tenderness. °· If you develop varicose veins: °¨ Wear support pantyhose or compression stockings as told by your healthcare provider. °¨ Elevate your feet for 15 minutes, 3-4 times a day. °Prenatal care  °· Write down your questions. Take them to your prenatal visits. °· Keep all your prenatal visits as told by your health care provider. This is important. °Safety  °· Wear your seat belt at all times when driving. °·   Make a list of emergency phone numbers, including numbers for family, friends, the hospital, and police and fire departments. °General instructions  °· Avoid cat litter boxes and soil used by cats. These carry germs that can cause birth defects in the baby. If you have a cat, ask someone to clean the litter box for you. °· Do not travel far distances unless it is absolutely necessary and only with the approval of your health care provider. °· Do not use hot tubs, steam rooms, or saunas. °· Do not drink alcohol. °· Do not use any products that contain nicotine or tobacco, such as cigarettes and e-cigarettes. If you need help quitting, ask your health care provider. °· Do not use any medicinal herbs or unprescribed drugs. These chemicals affect the formation and growth of the baby. °· Do not douche or use tampons or scented sanitary pads. °· Do not cross your legs for long periods of time. °· To prepare for the arrival of your baby: °¨ Take prenatal classes to understand, practice, and ask questions about labor and delivery. °¨ Make a trial run to the  hospital. °¨ Visit the hospital and tour the maternity area. °¨ Arrange for maternity or paternity leave through employers. °¨ Arrange for family and friends to take care of pets while you are in the hospital. °¨ Purchase a rear-facing car seat and make sure you know how to install it in your car. °¨ Pack your hospital bag. °¨ Prepare the baby’s nursery. Make sure to remove all pillows and stuffed animals from the baby's crib to prevent suffocation. °· Visit your dentist if you have not gone during your pregnancy. Use a soft toothbrush to brush your teeth and be gentle when you floss. °Contact a health care provider if: °· You are unsure if you are in labor or if your water has broken. °· You become dizzy. °· You have mild pelvic cramps, pelvic pressure, or nagging pain in your abdominal area. °· You have lower back pain. °· You have persistent nausea, vomiting, or diarrhea. °· You have an unusual or bad smelling vaginal discharge. °· You have pain when you urinate. °Get help right away if: °· Your water breaks before 37 weeks. °· You have regular contractions less than 5 minutes apart before 37 weeks. °· You have a fever. °· You are leaking fluid from your vagina. °· You have spotting or bleeding from your vagina. °· You have severe abdominal pain or cramping. °· You have rapid weight loss or weight gain. °· You have shortness of breath with chest pain. °· You notice sudden or extreme swelling of your face, hands, ankles, feet, or legs. °· Your baby makes fewer than 10 movements in 2 hours. °· You have severe headaches that do not go away when you take medicine. °· You have vision changes. °Summary °· The third trimester is from week 28 through week 40, months 7 through 9. The third trimester is a time when the unborn baby (fetus) is growing rapidly. °· During the third trimester, your discomfort may increase as you and your baby continue to gain weight. You may have abdominal, leg, and back pain, sleeping  problems, and an increased need to urinate. °· During the third trimester your breasts will keep growing and they will continue to become tender. A yellow fluid (colostrum) may leak from your breasts. This is the first milk you are producing for your baby. °· False labor is a condition in which you feel small,   irregular tightenings of the muscles in the womb (contractions) that eventually go away. These are called Braxton Hicks contractions. Contractions may last for hours, days, or even weeks before true labor sets in. °· Signs of labor can include: abdominal cramps; regular contractions that start at 10 minutes apart and become stronger and more frequent with time; watery or bloody mucus discharge that comes from the vagina; increased pelvic pressure and dull back pain; and leaking of amniotic fluid. °This information is not intended to replace advice given to you by your health care provider. Make sure you discuss any questions you have with your health care provider. °Document Released: 05/30/2001 Document Revised: 11/11/2015 Document Reviewed: 08/06/2012 °[employer]vier Interactive Patient Education © 2017 [employer] ° °Braxton Hicks Contractions °Contractions of the uterus can occur throughout pregnancy, but they are not always a sign that you are in labor. You may have practice contractions called Braxton Hicks contractions. These false labor contractions are sometimes confused with true labor. °What are Braxton Hicks contractions? °Braxton Hicks contractions are tightening movements that occur in the muscles of the uterus before labor. Unlike true labor contractions, these contractions do not result in opening (dilation) and thinning of the cervix. Toward the end of pregnancy (32-34 weeks), Braxton Hicks contractions can happen more often and may become stronger. These contractions are sometimes difficult to tell apart from true labor because they can be very uncomfortable. You should not feel embarrassed if  you go to the hospital with false labor. °Sometimes, the only way to tell if you are in true labor is for your health care provider to look for changes in the cervix. The health care provider will do a physical exam and may monitor your contractions. If you are not in true labor, the exam should show that your cervix is not dilating and your water has not broken. °If there are no prenatal problems or other health problems associated with your pregnancy, it is completely safe for you to be sent home with false labor. You may continue to have Braxton Hicks contractions until you go into true labor. °How can I tell the difference between true labor and false labor? °· Differences °¨ False labor °¨ Contractions last 30-70 seconds.: Contractions are usually shorter and not as strong as true labor contractions. °¨ Contractions become very regular.: Contractions are usually irregular. °¨ Discomfort is usually felt in the top of the uterus, and it spreads to the lower abdomen and low back.: Contractions are often felt in the front of the lower abdomen and in the groin. °¨ Contractions do not go away with walking.: Contractions may go away when you walk around or change positions while lying down. °¨ Contractions usually become more intense and increase in frequency.: Contractions get weaker and are shorter-lasting as time goes on. °¨ The cervix dilates and gets thinner.: The cervix usually does not dilate or become thin. °Follow these instructions at home: °¨ Take over-the-counter and prescription medicines only as told by your health care provider. °¨ Keep up with your usual exercises and follow other instructions from your health care provider. °¨ Eat and drink lightly if you think you are going into labor. °¨ If Braxton Hicks contractions are making you uncomfortable: °¨ Change your position from lying down or resting to walking, or change from walking to resting. °¨ Sit and rest in a tub of warm water. °¨ Drink enough  fluid to keep your urine clear or pale yellow. Dehydration may cause these contractions. °¨ Do   slow and deep breathing several times an hour. °¨ Keep all follow-up prenatal visits as told by your health care provider. This is important. °Contact a health care provider if: °¨ You have a fever. °¨ You have continuous pain in your abdomen. °Get help right away if: °¨ Your contractions become stronger, more regular, and closer together. °¨ You have fluid leaking or gushing from your vagina. °¨ You pass blood-tinged mucus (bloody show). °¨ You have bleeding from your vagina. °¨ You have low back pain that you never had before. °¨ You feel your baby’s head pushing down and causing pelvic pressure. °¨ Your baby is not moving inside you as much as it used to. °Summary °¨ Contractions that occur before labor are called Braxton Hicks contractions, false labor, or practice contractions. °¨ Braxton Hicks contractions are usually shorter, weaker, farther apart, and less regular than true labor contractions. True labor contractions usually become progressively stronger and regular and they become more frequent. °¨ Manage discomfort from Braxton Hicks contractions by changing position, resting in a warm bath, drinking plenty of water, or practicing deep breathing. °This information is not intended to replace advice given to you by your health care provider. Make sure you discuss any questions you have with your health care provider. °Document Released: 06/05/2005 Document Revised: 04/24/2016 Document Reviewed: 04/24/2016 °Elsevier Interactive Patient Education © 2017 [employer] ° °

## 2016-08-24 NOTE — Progress Notes (Signed)
Notified of pt arrival in MAU and exam. Requested provider to review tracing. Will monitor tracing for another 30 minutes for more accelerations.

## 2016-08-24 NOTE — Progress Notes (Signed)
CNM called back regarding pt tracing and accelerations. OK to discharge home.

## 2016-08-24 NOTE — MAU Note (Signed)
CNM at bedside and explained to patient and family why she was being discharged and reviewed labor precautions again. Patient and family ok with discharge.

## 2016-08-25 ENCOUNTER — Inpatient Hospital Stay (HOSPITAL_COMMUNITY)
Admission: AD | Admit: 2016-08-25 | Discharge: 2016-08-27 | DRG: 767 | Disposition: A | Discharge status: Home / Self Care | Payer: Medicaid Other | Source: Ambulatory Visit | Attending: Obstetrics & Gynecology | Admitting: Obstetrics & Gynecology

## 2016-08-25 ENCOUNTER — Encounter (HOSPITAL_COMMUNITY): Payer: Self-pay | Admitting: *Deleted

## 2016-08-25 ENCOUNTER — Encounter: Payer: Medicaid Other | Admitting: Family Medicine

## 2016-08-25 DIAGNOSIS — Z880 Allergy status to penicillin: Secondary | ICD-10-CM

## 2016-08-25 DIAGNOSIS — Z87891 Personal history of nicotine dependence: Secondary | ICD-10-CM

## 2016-08-25 DIAGNOSIS — Z302 Encounter for sterilization: Secondary | ICD-10-CM | POA: Diagnosis not present

## 2016-08-25 DIAGNOSIS — J45909 Unspecified asthma, uncomplicated: Secondary | ICD-10-CM | POA: Diagnosis present

## 2016-08-25 DIAGNOSIS — O3440 Maternal care for other abnormalities of cervix, unspecified trimester: Secondary | ICD-10-CM

## 2016-08-25 DIAGNOSIS — Z833 Family history of diabetes mellitus: Secondary | ICD-10-CM | POA: Diagnosis not present

## 2016-08-25 DIAGNOSIS — O9952 Diseases of the respiratory system complicating childbirth: Secondary | ICD-10-CM | POA: Diagnosis present

## 2016-08-25 DIAGNOSIS — Z3493 Encounter for supervision of normal pregnancy, unspecified, third trimester: Secondary | ICD-10-CM | POA: Diagnosis present

## 2016-08-25 DIAGNOSIS — Z3A39 39 weeks gestation of pregnancy: Secondary | ICD-10-CM

## 2016-08-25 DIAGNOSIS — R87619 Unspecified abnormal cytological findings in specimens from cervix uteri: Secondary | ICD-10-CM

## 2016-08-25 LAB — TYPE AND SCREEN
ABO/RH(D): O POS
ANTIBODY SCREEN: NEGATIVE

## 2016-08-25 LAB — CBC
HEMATOCRIT: 35.6 % — AB (ref 36.0–46.0)
Hemoglobin: 11.5 g/dL — ABNORMAL LOW (ref 12.0–15.0)
MCH: 24.1 pg — AB (ref 26.0–34.0)
MCHC: 32.3 g/dL (ref 30.0–36.0)
MCV: 74.6 fL — ABNORMAL LOW (ref 78.0–100.0)
Platelets: 209 10*3/uL (ref 150–400)
RBC: 4.77 MIL/uL (ref 3.87–5.11)
RDW: 16.4 % — AB (ref 11.5–15.5)
WBC: 13.7 10*3/uL — ABNORMAL HIGH (ref 4.0–10.5)

## 2016-08-25 MED ORDER — ONDANSETRON HCL 4 MG/2ML IJ SOLN: 4.0000 mg | Freq: Four times a day (QID) | INTRAMUSCULAR | Status: DC | PRN

## 2016-08-25 MED ORDER — OXYCODONE HCL 5 MG PO TABS
5.0000 mg | ORAL_TABLET | ORAL | Status: DC | PRN
  Administered 2016-08-25 – 2016-08-27 (×4): 5 mg via ORAL
  Filled 2016-08-25 (×4): qty 1

## 2016-08-25 MED ORDER — DIBUCAINE 1 % RE OINT: 1.0000 "application " | TOPICAL_OINTMENT | RECTAL | Status: DC | PRN

## 2016-08-25 MED ORDER — ACETAMINOPHEN 325 MG PO TABS: 650.0000 mg | ORAL_TABLET | ORAL | Status: DC | PRN

## 2016-08-25 MED ORDER — ONDANSETRON HCL 4 MG/2ML IJ SOLN: 4.0000 mg | INTRAMUSCULAR | Status: DC | PRN

## 2016-08-25 MED ORDER — PRENATAL MULTIVITAMIN CH: 1.0000 | ORAL_TABLET | Freq: Every day | ORAL | Status: DC

## 2016-08-25 MED ORDER — BENZOCAINE-MENTHOL 20-0.5 % EX AERO: 1.0000 "application " | INHALATION_SPRAY | CUTANEOUS | Status: DC | PRN

## 2016-08-25 MED ORDER — OXYTOCIN BOLUS FROM INFUSION: 500.0000 mL | Freq: Once | INTRAVENOUS | Status: DC

## 2016-08-25 MED ORDER — IBUPROFEN 600 MG PO TABS
600.0000 mg | ORAL_TABLET | Freq: Four times a day (QID) | ORAL | Status: DC
  Administered 2016-08-25 – 2016-08-27 (×6): 600 mg via ORAL
  Filled 2016-08-25 (×6): qty 1

## 2016-08-25 MED ORDER — TETANUS-DIPHTH-ACELL PERTUSSIS 5-2.5-18.5 LF-MCG/0.5 IM SUSP: 0.5000 mL | Freq: Once | INTRAMUSCULAR | Status: DC

## 2016-08-25 MED ORDER — COCONUT OIL OIL: 1.0000 "application " | TOPICAL_OIL | Status: DC | PRN

## 2016-08-25 MED ORDER — SOD CITRATE-CITRIC ACID 500-334 MG/5ML PO SOLN: 30.0000 mL | ORAL | Status: DC | PRN

## 2016-08-25 MED ORDER — OXYTOCIN 10 UNIT/ML IJ SOLN
INTRAMUSCULAR | Status: AC
  Administered 2016-08-25: 10 [IU]
  Filled 2016-08-25: qty 1

## 2016-08-25 MED ORDER — ZOLPIDEM TARTRATE 5 MG PO TABS: 5.0000 mg | ORAL_TABLET | Freq: Every evening | ORAL | Status: DC | PRN

## 2016-08-25 MED ORDER — OXYCODONE-ACETAMINOPHEN 5-325 MG PO TABS
1.0000 | ORAL_TABLET | ORAL | Status: DC | PRN
  Administered 2016-08-25: 1 via ORAL

## 2016-08-25 MED ORDER — LACTATED RINGERS IV SOLN
INTRAVENOUS | Status: DC
  Administered 2016-08-25: 11:00:00 via INTRAVENOUS

## 2016-08-25 MED ORDER — OXYCODONE-ACETAMINOPHEN 5-325 MG PO TABS
2.0000 | ORAL_TABLET | ORAL | Status: DC | PRN
  Filled 2016-08-25: qty 2

## 2016-08-25 MED ORDER — OXYTOCIN 40 UNITS IN LACTATED RINGERS INFUSION - SIMPLE MED
2.5000 [IU]/h | INTRAVENOUS | Status: DC
  Filled 2016-08-25: qty 1000

## 2016-08-25 MED ORDER — SENNOSIDES-DOCUSATE SODIUM 8.6-50 MG PO TABS
2.0000 | ORAL_TABLET | ORAL | Status: DC
  Administered 2016-08-25 – 2016-08-26 (×2): 2 via ORAL
  Filled 2016-08-25 (×2): qty 2

## 2016-08-25 MED ORDER — LIDOCAINE HCL (PF) 1 % IJ SOLN
30.0000 mL | INTRAMUSCULAR | Status: DC | PRN
Start: 2016-08-25 — End: 2016-08-25
  Filled 2016-08-25: qty 30

## 2016-08-25 MED ORDER — FLEET ENEMA 7-19 GM/118ML RE ENEM: 1.0000 | ENEMA | RECTAL | Status: DC | PRN

## 2016-08-25 MED ORDER — DIPHENHYDRAMINE HCL 25 MG PO CAPS: 25.0000 mg | ORAL_CAPSULE | Freq: Four times a day (QID) | ORAL | Status: DC | PRN

## 2016-08-25 MED ORDER — WITCH HAZEL-GLYCERIN EX PADS: 1.0000 "application " | MEDICATED_PAD | CUTANEOUS | Status: DC | PRN

## 2016-08-25 MED ORDER — LACTATED RINGERS IV SOLN: 500.0000 mL | INTRAVENOUS | Status: DC | PRN

## 2016-08-25 MED ORDER — SIMETHICONE 80 MG PO CHEW
80.0000 mg | CHEWABLE_TABLET | ORAL | Status: DC | PRN
  Administered 2016-08-26: 80 mg via ORAL
  Filled 2016-08-25: qty 1

## 2016-08-25 MED ORDER — ONDANSETRON HCL 4 MG PO TABS: 4.0000 mg | ORAL_TABLET | ORAL | Status: DC | PRN

## 2016-08-25 NOTE — H&P (Signed)
Anne Nguyen is a 26 y.o. female G3P1011, presenting for SOL. Anne Nguyen had a low risk pregnancy, and her prenatal care has been monitored for asthma, mild intermittent; abnormal cervical Papanicolaou smear affecting pregnancy, antepartum; Threatened preterm labor, third trimester; and Chlamydia infection affecting pregnancy, antepartum.  She arrived at MAU with intense regular contractions, no vaginal bleeding or fluid leakage, feeling good fetal movement. Denies headache, fevers, dizziness.    OB History    Gravida Para Term Preterm AB Living   3 1 1   1 1    SAB TAB Ectopic Multiple Live Births   1       1     Past Medical History:  Diagnosis Date  . Asthma    Past Surgical History:  Procedure Laterality Date  . NO PAST SURGERIES    . WISDOM TOOTH EXTRACTION     Family History: family history includes Cancer in her maternal grandmother; Diabetes in her paternal grandmother. Social History:  reports that she quit smoking about 2 years ago. Her smoking use included Cigarettes. She smoked 0.50 packs per day. She has quit using smokeless tobacco. She reports that she does not drink alcohol or use drugs.  Clinic [facility] Prenatal Labs  Dating First trimester US Blood type:   O pos  Genetic Screen Quad:  No increased risk Antibody: neg  Anatomic US WNL Rubella:  IMM  GTT  Third trimester: 70 RPR:   NR  Flu vaccine declined HBsAg:   NR  TDaP vaccine declined HIV:   neg  Baby Food Breast                                     GBS: (For PCN allergy, check sensitivities)  Contraception Desires BTL Title XIX papers signed 06/26/2016 Need to readdress due to issues of regret Pap:01/17/16 LGSIL + hrHPV  Circumcision Desires   Pediatrician Had oed in [place]- does not know the name of the group   Support Person Mother and friend       Maternal Diabetes: No Genetic Screening: Normal Maternal Ultrasounds/Referrals: Normal Fetal Ultrasounds or other Referrals:  None Maternal Substance Abuse:   No Significant Maternal Medications:  None Significant Maternal Lab Results:  GBS: not avaiable Other Comments:  None  Review of Systems  Constitutional: Negative for fever.  Eyes: Negative for blurred vision and double vision.  Respiratory: Negative for shortness of breath.   Cardiovascular: Positive for leg swelling. Negative for chest pain.  Gastrointestinal: Negative for abdominal pain, nausea and vomiting.  Genitourinary: Negative for dysuria.  Neurological: Negative for dizziness and headaches.   Maternal Medical History:  Reason for admission: Nausea.    Dilation:  (SVD viable female ) Effacement (%): 90 Station: -1 Exam by:: kathrine kooista,CNM Last menstrual period 11/14/2015, unknown if currently breastfeeding. Exam Physical Exam  Constitutional: She is oriented to person, place, and time. She appears well-developed and well-nourished.  HENT:  Head: Normocephalic and atraumatic.  Eyes: Conjunctivae and EOM are normal. Pupils are equal, round, and reactive to light.  Respiratory: Effort normal. No respiratory distress. She has no wheezes.  GI: Soft. She exhibits no distension. There is no tenderness. There is no rebound and no guarding.  gravid  Neurological: She is alert and oriented to person, place, and time.  Skin: Skin is warm and dry. No rash noted. No pallor.   SVE: 8/90/-1, bulging bag Fetal monitoring: baseline 140, moderate  variability, no acels, no decels  Prenatal labs: ABO, Rh: --/--/O POS (03/09 1045) Antibody: NEG (03/09 1045) Rubella: Immune (02/01 0000) RPR: Non Reactive (02/01 1910)  HBsAg: Negative (02/01 0000)  HIV: Non-reactive (02/01 0000)  GBS: Negative (02/01 0000)   Assessment/Plan: Admit to SOL Orders placed Pt wants BTL, needs to be readressed AROM: thick meconium, NICU team called to be present during delivery   Anne Nguyen 08/25/2016, 11:43 AM

## 2016-08-25 NOTE — Progress Notes (Signed)
Pt presents to MAU with c/o contractions. Pt very uncomfortable.Pt denies bleeding and leaking. Pt denies problems with this pregnancy. SVE by CNM 7cm. Pt to room 167 via stretcher with CNM present.

## 2016-08-25 NOTE — Lactation Note (Signed)
This note was copied from a baby's chart. Lactation Consultation Note Initial visit at 10 hours of age.  Mom reports good feedings and denies pain or concerns.  Baby is 4163w5d at 5#14 oz with low glucose levels needing dextrose gel and supplementation.  Last glucose level was 54 following a feeding and has a pending glucose later tonight.  [facility] resources given and discussed.  Encouraged to feed with early cues on demand.  Early newborn behavior discussed.  Hand expression demonstrated with colostrum visible. LC provided mom with colostrum container to collect EBM and then supplement with spoon or syringe as needed.  LC provided written communication sheet regarding LPT infant policy and how it may relate to her baby <6#.  Mom to call for assist as needed.  Report given to RN who will set up DEBP if baby does not continue to latch well at the breast and needs supplement.        Patient Name: Anne Nguyen Reason for consult: Initial assessment;Infant < 6lbs   Maternal Data Has patient been taught Hand Expression?: Yes Does the patient have breastfeeding experience prior to this delivery?: Yes  Feeding Feeding Type: Breast Milk with Formula added Length of feed: 0 min  LATCH Score/Interventions Latch: Too sleepy or reluctant, no latch achieved, no sucking elicited.              Intervention(s): Breastfeeding basics reviewed     Lactation Tools Discussed/Used WIC Program:  (plans to call to enroll)   Consult Status Consult Status: Follow-up Date: 08/26/16 Follow-up type: In-patient    Jannifer RodneyShoptaw, Tiegan Jambor Lynn Nguyen, 9:44 PM

## 2016-08-26 ENCOUNTER — Inpatient Hospital Stay (HOSPITAL_COMMUNITY): Payer: Medicaid Other | Admitting: Certified Registered Nurse Anesthetist

## 2016-08-26 ENCOUNTER — Encounter (HOSPITAL_COMMUNITY): Payer: Self-pay | Admitting: Anesthesiology

## 2016-08-26 ENCOUNTER — Encounter (HOSPITAL_COMMUNITY)
Admission: AD | Disposition: A | Discharge status: Home / Self Care | Payer: Self-pay | Source: Ambulatory Visit | Attending: Obstetrics & Gynecology

## 2016-08-26 DIAGNOSIS — Z302 Encounter for sterilization: Secondary | ICD-10-CM

## 2016-08-26 HISTORY — PX: TUBAL LIGATION: SHX77

## 2016-08-26 LAB — RPR: RPR: NONREACTIVE

## 2016-08-26 SURGERY — LIGATION, FALLOPIAN TUBE, POSTPARTUM
Anesthesia: Spinal | Site: Abdomen

## 2016-08-26 MED ORDER — HYDROMORPHONE HCL 1 MG/ML IJ SOLN
0.2500 mg | INTRAMUSCULAR | Status: DC | PRN
  Administered 2016-08-26 (×2): 0.5 mg via INTRAVENOUS

## 2016-08-26 MED ORDER — FENTANYL CITRATE (PF) 100 MCG/2ML IJ SOLN
INTRAMUSCULAR | Status: AC
  Filled 2016-08-26: qty 2

## 2016-08-26 MED ORDER — FAMOTIDINE 20 MG PO TABS
40.0000 mg | ORAL_TABLET | Freq: Once | ORAL | Status: AC
  Administered 2016-08-26: 40 mg via ORAL
  Filled 2016-08-26: qty 2

## 2016-08-26 MED ORDER — BUPIVACAINE HCL (PF) 0.25 % IJ SOLN
INTRAMUSCULAR | Status: AC
  Filled 2016-08-26: qty 30

## 2016-08-26 MED ORDER — BUPIVACAINE IN DEXTROSE 0.75-8.25 % IT SOLN
INTRATHECAL | Status: DC | PRN
  Administered 2016-08-26: 1.6 mL via INTRATHECAL

## 2016-08-26 MED ORDER — ONDANSETRON HCL 4 MG/2ML IJ SOLN
INTRAMUSCULAR | Status: AC
  Filled 2016-08-26: qty 2

## 2016-08-26 MED ORDER — HYDROMORPHONE HCL 1 MG/ML IJ SOLN
INTRAMUSCULAR | Status: AC
  Administered 2016-08-26: 0.5 mg via INTRAVENOUS
  Filled 2016-08-26: qty 1

## 2016-08-26 MED ORDER — PROMETHAZINE HCL 25 MG/ML IJ SOLN: 6.2500 mg | INTRAMUSCULAR | Status: DC | PRN

## 2016-08-26 MED ORDER — FENTANYL CITRATE (PF) 100 MCG/2ML IJ SOLN
INTRAMUSCULAR | Status: DC | PRN
  Administered 2016-08-26: 50 ug via INTRAVENOUS

## 2016-08-26 MED ORDER — BUPIVACAINE HCL (PF) 0.25 % IJ SOLN
INTRAMUSCULAR | Status: DC | PRN
  Administered 2016-08-26: 7 mL

## 2016-08-26 MED ORDER — LACTATED RINGERS IV SOLN: INTRAVENOUS | Status: DC

## 2016-08-26 MED ORDER — MEPERIDINE HCL 25 MG/ML IJ SOLN: 6.2500 mg | INTRAMUSCULAR | Status: DC | PRN

## 2016-08-26 MED ORDER — MIDAZOLAM HCL 2 MG/2ML IJ SOLN
INTRAMUSCULAR | Status: DC | PRN
  Administered 2016-08-26: 2 mg via INTRAVENOUS

## 2016-08-26 MED ORDER — MIDAZOLAM HCL 2 MG/2ML IJ SOLN
INTRAMUSCULAR | Status: AC
  Filled 2016-08-26: qty 2

## 2016-08-26 MED ORDER — KETOROLAC TROMETHAMINE 30 MG/ML IJ SOLN: 30.0000 mg | Freq: Once | INTRAMUSCULAR | Status: DC

## 2016-08-26 MED ORDER — ONDANSETRON HCL 4 MG/2ML IJ SOLN
INTRAMUSCULAR | Status: DC | PRN
  Administered 2016-08-26: 4 mg via INTRAVENOUS

## 2016-08-26 MED ORDER — METOCLOPRAMIDE HCL 10 MG PO TABS
10.0000 mg | ORAL_TABLET | Freq: Once | ORAL | Status: AC
  Administered 2016-08-26: 10 mg via ORAL
  Filled 2016-08-26: qty 1

## 2016-08-26 MED ORDER — LACTATED RINGERS IV SOLN
INTRAVENOUS | Status: DC | PRN
  Administered 2016-08-26 (×2): via INTRAVENOUS

## 2016-08-26 SURGICAL SUPPLY — 21 items
BLADE SURG 11 STRL SS (BLADE) ×2 IMPLANT
CLIP FILSHIE TUBAL LIGA STRL (Clip) ×3 IMPLANT
CLOTH BEACON ORANGE TIMEOUT ST (SAFETY) ×2 IMPLANT
DRSG OPSITE POSTOP 3X4 (GAUZE/BANDAGES/DRESSINGS) ×2 IMPLANT
DURAPREP 26ML APPLICATOR (WOUND CARE) ×2 IMPLANT
GLOVE BIO SURGEON STRL SZ 6.5 (GLOVE) ×2 IMPLANT
GLOVE BIOGEL PI IND STRL 7.0 (GLOVE) ×2 IMPLANT
GLOVE BIOGEL PI INDICATOR 7.0 (GLOVE) ×2
GOWN STRL REUS W/TWL LRG LVL3 (GOWN DISPOSABLE) ×4 IMPLANT
NEEDLE HYPO 22GX1.5 SAFETY (NEEDLE) ×2 IMPLANT
NS IRRIG 1000ML POUR BTL (IV SOLUTION) ×2 IMPLANT
PACK ABDOMINAL MINOR (CUSTOM PROCEDURE TRAY) ×2 IMPLANT
PROTECTOR NERVE ULNAR (MISCELLANEOUS) ×2 IMPLANT
SPONGE LAP 4X18 X RAY DECT (DISPOSABLE) IMPLANT
SUT VIC AB 0 CT1 27 (SUTURE) ×2
SUT VIC AB 0 CT1 27XBRD ANBCTR (SUTURE) ×1 IMPLANT
SUT VICRYL 4-0 PS2 18IN ABS (SUTURE) ×2 IMPLANT
SYR CONTROL 10ML LL (SYRINGE) ×2 IMPLANT
TOWEL OR 17X24 6PK STRL BLUE (TOWEL DISPOSABLE) ×4 IMPLANT
TRAY FOLEY CATH SILVER 16FR (SET/KITS/TRAYS/PACK) ×2 IMPLANT
WATER STERILE IRR 1000ML POUR (IV SOLUTION) ×2 IMPLANT

## 2016-08-26 NOTE — Progress Notes (Signed)
Post Partum Day#1 Subjective: no complaints, up ad lib, voiding and tolerating PO  She is adament that she wants a BTL today for permanent sterility. She is aware of other LARCs. She signed her MCD forms more than 2 months ago.  Objective: Blood pressure 115/62, pulse 77, temperature 97.9 F (36.6 C), temperature source Oral, resp. rate 18, last menstrual period 11/14/2015, unknown if currently breastfeeding.  Physical Exam:  General: alert Lochia: appropriate Uterine Fundus: firm and NT at U-1 DVT Evaluation: No evidence of DVT seen on physical exam.   Recent Labs  08/25/16 1045  HGB 11.5*  HCT 35.6*    Assessment/Plan: Plan for discharge tomorrow after BTL this morning   LOS: 1 day   Anne Nguyen C Felix Meras 08/26/2016, 7:12 AM

## 2016-08-26 NOTE — Progress Notes (Signed)
Patient transported to OR for procedure via stretcher.

## 2016-08-26 NOTE — Anesthesia Postprocedure Evaluation (Signed)
Anesthesia Post Note  Patient: Anne Nguyen  Procedure(s) Performed: Procedure(s) (LRB): POST PARTUM TUBAL LIGATION (N/A)  Patient location during evaluation: PACU Anesthesia Type: Spinal Level of consciousness: awake Pain management: pain level controlled Vital Signs Assessment: post-procedure vital signs reviewed and stable Respiratory status: spontaneous breathing Cardiovascular status: stable Postop Assessment: no headache, no backache, spinal receding, patient able to bend at knees and no signs of nausea or vomiting Anesthetic complications: no        Last Vitals:  Vitals:   08/26/16 1515 08/26/16 1530  BP: 91/73 (!) 107/54  Pulse: 90 78  Resp: 14 14  Temp: 36.5 C     Last Pain:  Vitals:   08/26/16 1345  TempSrc: Oral  PainSc:    Pain Goal: Patients Stated Pain Goal: 2 (08/25/16 1709)               Chamari Cutbirth JR,JOHN Susann GivensFRANKLIN

## 2016-08-26 NOTE — Progress Notes (Signed)
26 y.o. [id number] with undesired fertility desires permanent sterilization. Risks and benefits of postpartum tubal sterilization procedure was discussed with the patient including permanence of method, risk of future regret,  bleeding, infection, injury to surrounding organs, anesthesia and need for additional procedures. Risk failure of 0.5-1% with increased risk of ectopic gestation if pregnancy occurs was also discussed with patient. Patient verbalized understanding and all questions were answered. Interval BTL and LARC offered and she wants to proceed, NPO since delivery.  Currie ParisJames G. Debroah LoopArnold MD 08/26/2016

## 2016-08-26 NOTE — Anesthesia Procedure Notes (Signed)
Spinal  Patient location during procedure: OR Start time: 08/26/2016 2:34 PM End time: 08/26/2016 2:41 PM Staffing Anesthesiologist: Leilani AbleHATCHETT, Elsye Mccollister Performed: anesthesiologist  Preanesthetic Checklist Completed: patient identified, surgical consent, pre-op evaluation, timeout performed, IV checked, risks and benefits discussed and monitors and equipment checked Spinal Block Patient position: sitting Prep: site prepped and draped and DuraPrep Patient monitoring: heart rate, cardiac monitor, continuous pulse ox and blood pressure Approach: midline Location: L3-4 Injection technique: single-shot Needle Needle type: Pencan  Needle gauge: 24 G Needle length: 9 cm Needle insertion depth: 6 cm Assessment Sensory level: T6

## 2016-08-26 NOTE — Op Note (Signed)
Anne Nguyen 08/25/2016 - 08/26/2016  PREOPERATIVE DIAGNOSIS:  Multiparity, undesired fertility  POSTOPERATIVE DIAGNOSIS:  Multiparity, undesired fertility  PROCEDURE:  Postpartum Bilateral Tubal Sterilization using Filshie Clips   ANESTHESIA:  Spinal  COMPLICATIONS:  None immediate.  ESTIMATED BLOOD LOSS:  Less than 20 ml.  FLUIDS: 1000 ml LR.  URINE OUTPUT:  30 ml of clear urine.  INDICATIONS: 26 y.o. [id number]  with undesired fertility,status post vaginal delivery, desires permanent sterilization. Risks and benefits of procedure discussed with patient including permanence of method, future regret, bleeding, infection, injury to surrounding organs and need for additional procedures. Risk failure of 0.5-1% with increased risk of ectopic gestation if pregnancy occurs was also discussed with patient.   FINDINGS:  Normal uterus, tubes, and ovaries.  TECHNIQUE:  The patient was taken to the operating room where spinal anesthesia was dosed up to surgical level and found to be adequate.  She was then placed in the dorsal supine position and prepped and draped in sterile fashion.  After an adequate timeout was performed, attention was turned to the patient's abdomen where a small transverse skin incision was made under the umbilical fold. The incision was taken down to the layer of fascia using the scalpel, and fascia was incised, and extended bilaterally using Mayo scissors. The peritoneum was entered in a sharp fashion. Attention was then turned to the patient's uterus, and left fallopian tube was identified and followed out to the fimbriated end.  A Filshie clip was placed on the left fallopian tube about 2 cm from the cornual attachment, with care given to incorporate the underlying mesosalpinx.  A similar process was carried out on the right side allowing for bilateral tubal sterilization.  Good hemostasis was noted overall.  Local analgesia was drizzled on both operative sites.The instruments were  then removed from the patient's abdomen and the fascial incision was repaired with 0 Vicryl, and the skin was closed with a 4-0 Vicryl subcuticular stitch. The patient tolerated the procedure well.  Sponge, lap, and needle counts were correct times two.  The patient was then taken to the recovery room awake and in stable condition.  Scheryl DarterJames Emanuel Campos MD 08/26/2016 3:13 PM

## 2016-08-26 NOTE — Anesthesia Preprocedure Evaluation (Signed)
Anesthesia Evaluation  Patient identified by MRN, date of birth, ID band Patient awake    Reviewed: Allergy & Precautions, H&P , NPO status , Patient's Chart, lab work & pertinent test results  Airway Mallampati: I  TM Distance: >3 FB Neck ROM: full    Dental no notable dental hx.    Pulmonary former smoker,    Pulmonary exam normal        Cardiovascular negative cardio ROS Normal cardiovascular exam     Neuro/Psych negative neurological ROS  negative psych ROS   GI/Hepatic negative GI ROS, Neg liver ROS,   Endo/Other  negative endocrine ROS  Renal/GU negative Renal ROS     Musculoskeletal   Abdominal Normal abdominal exam  (+)   Peds  Hematology negative hematology ROS (+)   Anesthesia Other Findings   Reproductive/Obstetrics (+) Pregnancy                             Anesthesia Physical Anesthesia Plan  ASA: II  Anesthesia Plan: Spinal   Post-op Pain Management:    Induction:   Airway Management Planned:   Additional Equipment:   Intra-op Plan:   Post-operative Plan:   Informed Consent: I have reviewed the patients History and Physical, chart, labs and discussed the procedure including the risks, benefits and alternatives for the proposed anesthesia with the patient or authorized representative who has indicated his/her understanding and acceptance.     Plan Discussed with: CRNA and Surgeon  Anesthesia Plan Comments:         Anesthesia Quick Evaluation  

## 2016-08-26 NOTE — Transfer of Care (Signed)
Immediate Anesthesia Transfer of Care Note  Patient: Anne Nguyen  Procedure(s) Performed: Procedure(s): POST PARTUM TUBAL LIGATION (N/A)  Patient Location: PACU  Anesthesia Type:Spinal  Level of Consciousness: awake and alert   Airway & Oxygen Therapy: Patient Spontanous Breathing  Post-op Assessment: Report given to RN and Post -op Vital signs reviewed and stable  Post vital signs: Reviewed  Last Vitals:  Vitals:   08/26/16 0527 08/26/16 1345  BP: 115/62 114/64  Pulse: 77 72  Resp: 18 16  Temp: 36.6 C 37.1 C    Last Pain:  Vitals:   08/26/16 1345  TempSrc: Oral  PainSc:       Patients Stated Pain Goal: 2 (08/25/16 1709)  Complications: No apparent anesthesia complications

## 2016-08-26 NOTE — Lactation Note (Signed)
This note was copied from a baby's chart. Lactation Consultation Note  Patient Name: Anne Marisa SeverinMykia Nguyen Today's Date: 08/26/2016 Reason for consult: Initial assessment Baby at 32 hr of life. Mom is back from BLT. She is requesting to use DEBP. Showed family how to set up and clean parts. Mom reports baby is latching well. She denies breast or nipple pain. She voiced no concerns. She declined latch help. Family member stated baby "just had some formula". Discussed baby behavior, feeding frequency, pumping, spoon feeding, baby belly size, voids, wt loss, breast changes, and nipple care. Mom is aware of lactation services and support group. She will call at the next bf for lactation to observe a latch.     Maternal Data    Feeding Feeding Type: Breast Fed  LATCH Score/Interventions Latch: Repeated attempts needed to sustain latch, nipple held in mouth throughout feeding, stimulation needed to elicit sucking reflex. Intervention(s): Skin to skin;Teach feeding cues;Waking techniques Intervention(s): Adjust position;Assist with latch;Breast massage  Audible Swallowing: Spontaneous and intermittent Intervention(s): Skin to skin;Hand expression;Alternate breast massage  Type of Nipple: Everted at rest and after stimulation  Comfort (Breast/Nipple): Soft / non-tender     Hold (Positioning): Assistance needed to correctly position infant at breast and maintain latch. Intervention(s): Breastfeeding basics reviewed;Support Pillows;Position options  LATCH Score: 8  Lactation Tools Discussed/Used WIC Program: Yes Pump Review: Setup, frequency, and cleaning;Milk Storage;Other (comment) (pump settings) Initiated by:: ES Date initiated:: 08/26/16   Consult Status Consult Status: Follow-up Date: 08/27/16 Follow-up type: In-patient    [name] 08/26/2016, 7:25 PM

## 2016-08-27 MED ORDER — IBUPROFEN 600 MG PO TABS: 600.0000 mg | ORAL_TABLET | Freq: Four times a day (QID) | ORAL | 0 refills | Status: AC

## 2016-08-27 MED ORDER — OXYCODONE HCL 5 MG PO TABS: 5.0000 mg | ORAL_TABLET | ORAL | 0 refills | Status: AC | PRN

## 2016-08-27 NOTE — Discharge Summary (Signed)
Obstetric Discharge Summary Reason for Admission: onset of labor Prenatal Procedures: ultrasound Intrapartum Procedures: spontaneous vaginal delivery Postpartum Procedures: P.P. tubal ligation Complications-Operative and Postpartum: none Hemoglobin  Date Value Ref Range Status  08/25/2016 11.5 (L) 12.0 - 15.0 g/dL Final   HCT  Date Value Ref Range Status  08/25/2016 35.6 (L) 36.0 - 46.0 % Final    Physical Exam:  General: alert Lochia: appropriate Uterine Fundus: firm Incision: healing well DVT Evaluation: No evidence of DVT seen on physical exam.  Discharge Diagnoses: Term Pregnancy-delivered  Discharge Information: Date: 08/27/2016 Activity: pelvic rest Diet: routine Medications: PNV, Ibuprofen, Percocet and pepcid Condition: stable Instructions: refer to practice specific booklet Discharge to: home Follow-up Information    [facility]. Schedule an appointment as soon as possible for a visit in 6 week(s).   Specialty:  Obstetrics and Gynecology Contact information: [address] [place] [phone]          Newborn Data: Live born female  Birth Weight: 5 lb 14 oz (2665 g) APGAR: 7, 8  Home with mother.  Allie BossierMyra C Keiland Pickering 08/27/2016, 8:23 AM

## 2016-08-27 NOTE — Addendum Note (Signed)
Addendum  created 08/27/16 0736 by Yolonda KidaAlison L Aneita Kiger, CRNA   Sign clinical note

## 2016-08-27 NOTE — Discharge Instructions (Signed)

## 2016-08-27 NOTE — Anesthesia Postprocedure Evaluation (Addendum)
Anesthesia Post Note  Patient: Anne Nguyen  Procedure(s) Performed: Procedure(s) (LRB): POST PARTUM TUBAL LIGATION (N/A)  Patient location during evaluation: Mother Baby Anesthesia Type: Spinal Level of consciousness: awake, awake and alert, oriented and patient cooperative Pain management: pain level controlled Vital Signs Assessment: post-procedure vital signs reviewed and stable Respiratory status: spontaneous breathing, nonlabored ventilation and respiratory function stable Cardiovascular status: stable Postop Assessment: no headache, no backache, patient able to bend at knees and no signs of nausea or vomiting Anesthetic complications: no        Last Vitals:  Vitals:   08/27/16 0137 08/27/16 0531  BP: 113/64 112/66  Pulse: 76 72  Resp: 18 18  Temp: 36.7 C 36.6 C    Last Pain:  Vitals:   08/27/16 0532  TempSrc:   PainSc: 0-No pain   Pain Goal: Patients Stated Pain Goal: 2 (08/25/16 1709)               CARVER,ALISON L

## 2016-08-28 ENCOUNTER — Encounter (HOSPITAL_COMMUNITY): Payer: Self-pay | Admitting: Obstetrics & Gynecology

## 2016-08-28 ENCOUNTER — Encounter (HOSPITAL_COMMUNITY): Payer: Self-pay

## 2016-08-28 ENCOUNTER — Encounter: Payer: Medicaid Other | Admitting: Family Medicine

## 2016-09-03 ENCOUNTER — Inpatient Hospital Stay (HOSPITAL_COMMUNITY): Admission: RE | Admit: 2016-09-03 | Payer: Medicaid Other | Source: Ambulatory Visit

## 2016-10-05 ENCOUNTER — Ambulatory Visit (INDEPENDENT_AMBULATORY_CARE_PROVIDER_SITE_OTHER): Payer: Medicaid Other | Admitting: Family Medicine

## 2016-10-05 NOTE — Progress Notes (Signed)
Post Partum Exam  Anne Nguyen is a 26 y.o. G34P2012 female who presents for a postpartum visit. She is 5 weeks postpartum following a spontaneous vaginal delivery. I have fully reviewed the prenatal and intrapartum course. The delivery was at 39.5 gestational weeks.  Anesthesia: none. Postpartum course has beenuneventful. Baby's course has been uneventful. Baby is feeding by bottle - -. Bleeding pink. Bowel function is normal. Bladder function is normal. Patient is not sexually active. Contraception method is tubal ligation. Postpartum depression screening:neg (score 1)  The following portions of the patient's history were reviewed and updated as appropriate: allergies, current medications, past family history, past medical history, past social history, past surgical history and problem list.  Review of Systems Pertinent items are noted in HPI.    Objective:  unknown if currently breastfeeding.  General:  alert, cooperative and no distress  Lungs: clear to auscultation bilaterally  Heart:  regular rate and rhythm, S1, S2 normal, no murmur, click, rub or gallop  Abdomen: soft, non-tender; bowel sounds normal; no masses,  no organomegaly        Assessment:    Normal postpartum exam. Pap smear not done at today's visit.   Plan:   1. Contraception: tubal ligation 2. Follow up in: July for Repap due to LSIL

## 2016-11-24 NOTE — Addendum Note (Signed)
Addendum  created 11/24/16 1046 by Gabbrielle Mcnicholas, MD   Sign clinical note    

## 2016-11-29 ENCOUNTER — Ambulatory Visit: Payer: Medicaid Other | Admitting: Obstetrics & Gynecology

## 2017-07-27 IMAGING — US US MFM OB COMP +14 WKS
1 series · 12 of 28 positions shown · non-contrast
Comparison: none

[Series 1: us mfm ob comp +14 wks · 67 acquisitions, 12 frames shown]
[im 3/67]
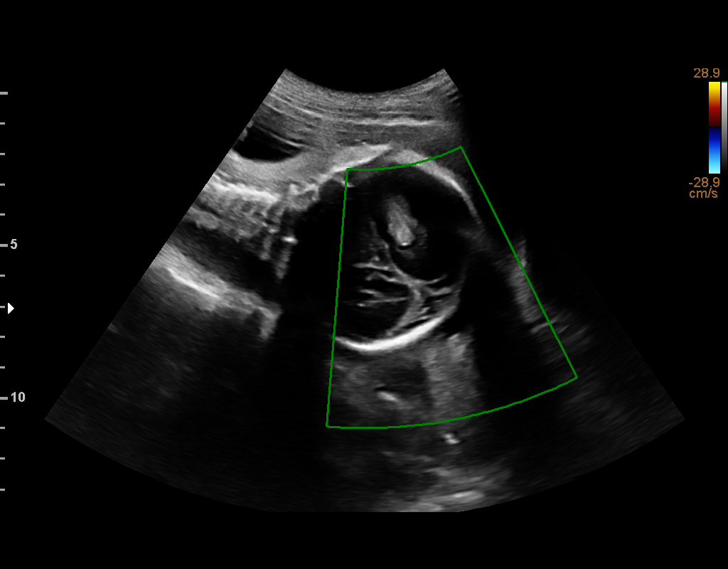
[im 8/67]
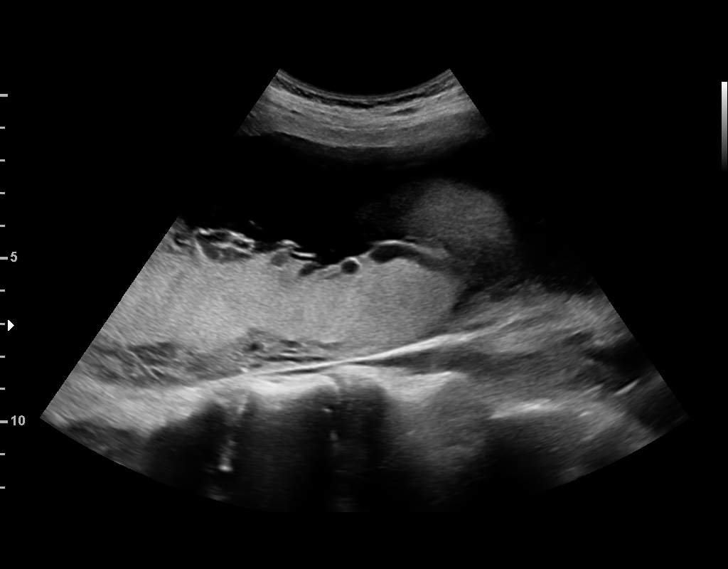
[im 13/67]
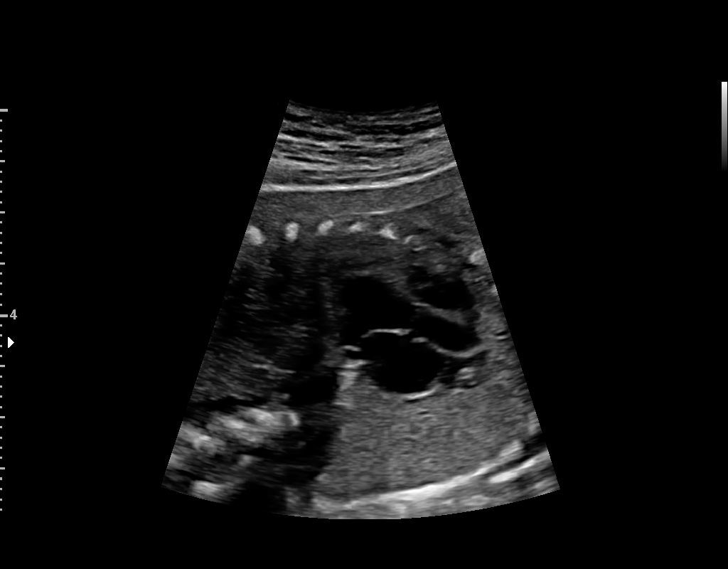
[im 20/67]
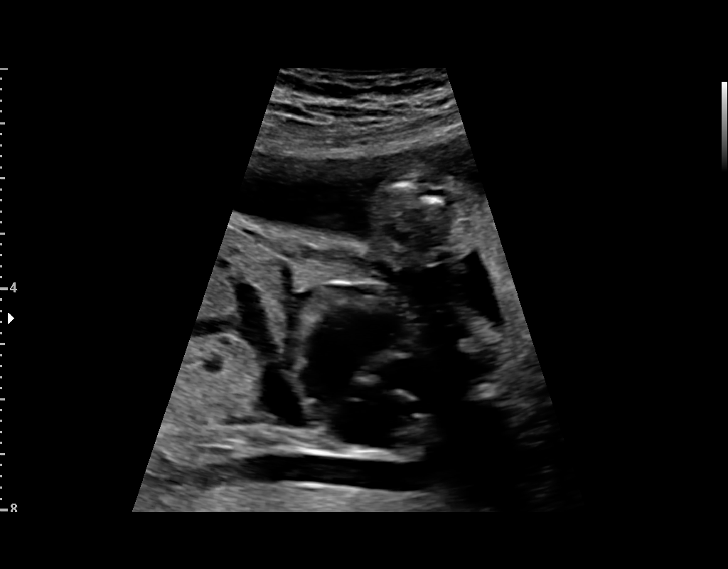
[im 25/67]
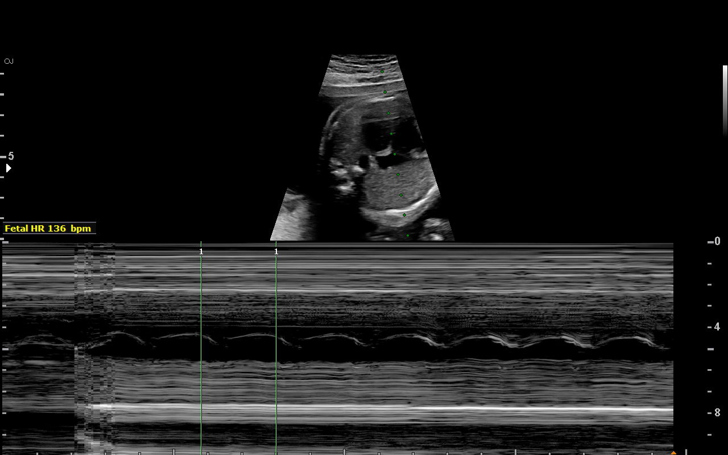
[im 30/67]
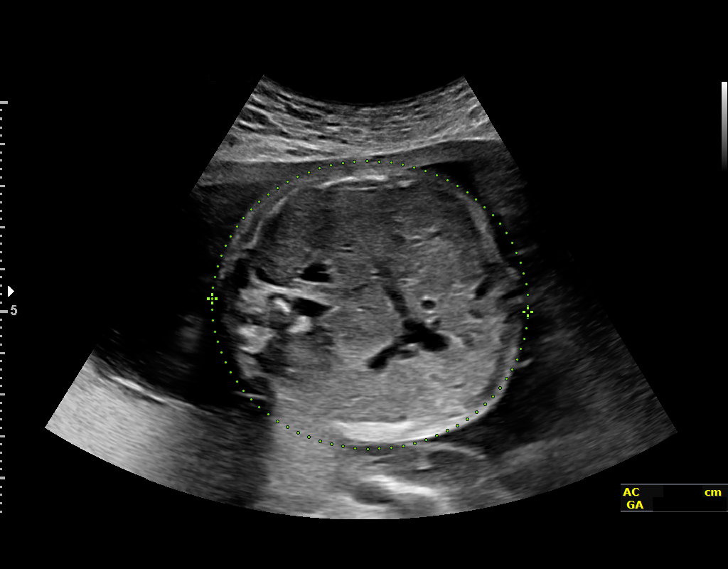
[im 37/67]
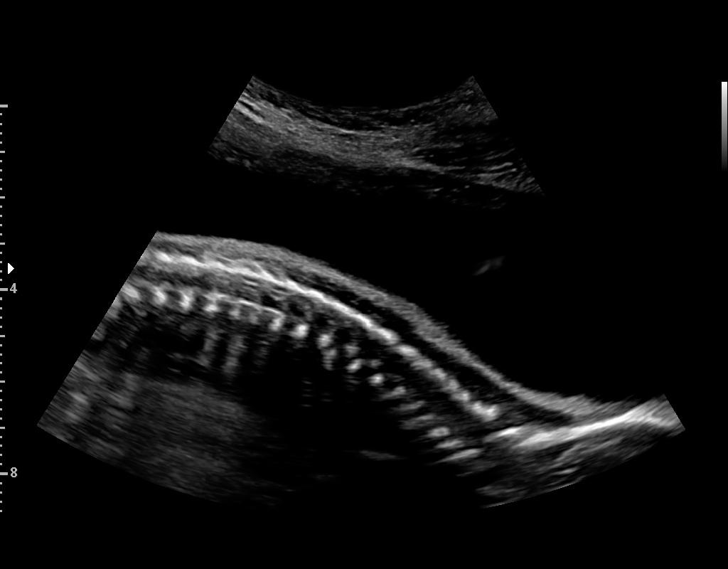
[im 42/67]
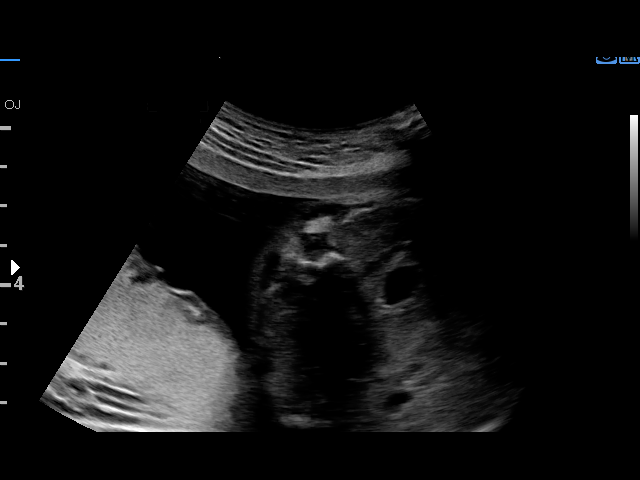
[im 47/67]
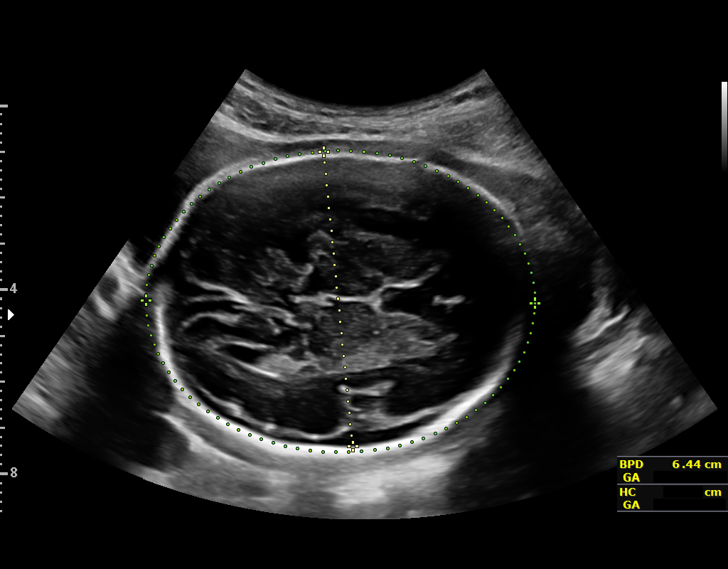
[im 54/67]
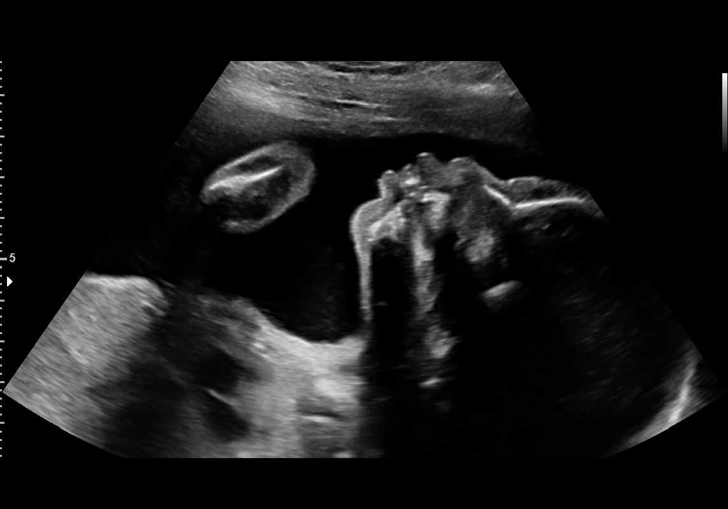
[im 59/67]
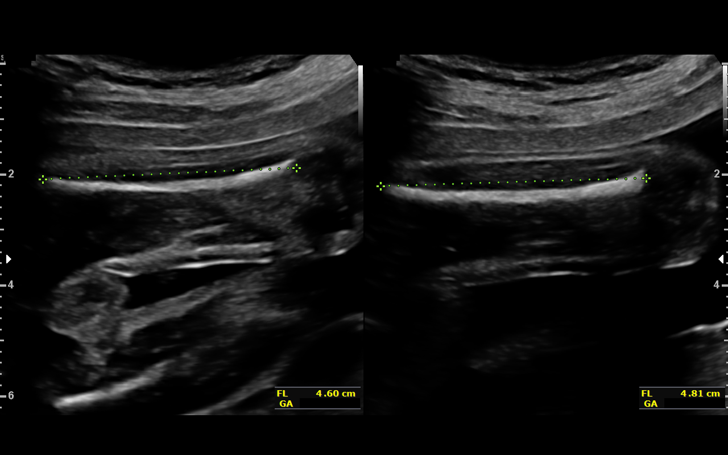
[im 64/67]
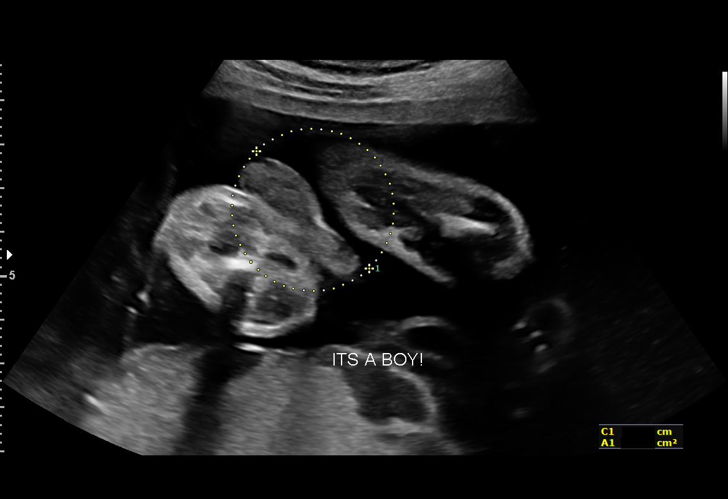

[12 of 28 positions shown; findings below may reference images not displayed]

1  CHEOL BLESS              [id number]      [id number]     [id number]
Indications

27 weeks gestation of pregnancy
Encounter for antenatal screening for
malformations
OB History

Blood Type:            Height:  5'6"   Weight (lb):  143      BMI:
Gravidity:    3         Term:   1        Prem:   0
Fetal Evaluation

Num Of Fetuses:     1
Fetal Heart         136
Rate(bpm):
Cardiac Activity:   Observed
Presentation:       Cephalic
Placenta:           Posterior, above cervical os
P. Cord Insertion:  Visualized

Amniotic Fluid
AFI FV:      Subjectively within normal limits

Largest Pocket(cm)
4.68
Biometry

BPD:      64.3  mm     G. Age:  26w 0d          4  %    CI:        73.66   %   70 - 86
FL/HC:      20.0   %   18.8 -
HC:       238   mm     G. Age:  25w 6d        < 3  %    HC/AC:      1.07       1.05 -
AC:      221.7  mm     G. Age:  26w 4d         17  %    FL/BPD:     73.9   %   71 - 87
FL:       47.5  mm     G. Age:  25w 6d          4  %    FL/AC:      21.4   %   20 - 24
HUM:      45.1  mm     G. Age:  26w 5d         27  %
CER:      32.4  mm     G. Age:  28w 3d         60  %
CM:        5.9  mm

Est. FW:     910  gm          2 lb      24  %
Gestational Age

LMP:           27w 4d       Date:   11/14/15                 EDD:   08/20/16
U/S Today:     26w 1d                                        EDD:   08/30/16
Best:          27w 4d    Det. By:   LMP  (11/14/15)          EDD:   08/20/16
Anatomy

Cranium:               Appears normal         Aortic Arch:            Appears normal
Cavum:                 Appears normal         Ductal Arch:            Appears normal
Ventricles:            Appears normal         Diaphragm:              Appears normal
Choroid Plexus:        Appears normal         Stomach:                Appears normal, left
sided
Cerebellum:            Appears normal         Abdomen:                Appears normal
Posterior Fossa:       Appears normal         Abdominal Wall:         Appears nml (cord
insert, abd wall)
Nuchal Fold:           Appears normal         Cord Vessels:           Appears normal (3
vessel cord)
Face:                  Appears normal         Kidneys:                Appear normal
(orbits and profile)
Lips:                  Appears normal         Bladder:                Appears normal
Thoracic:              Appears normal         Spine:                  Appears normal
Heart:                 Appears normal         Upper Extremities:      Appears normal
(4CH, axis, and situs
RVOT:                  Appears normal         Lower Extremities:      Appears normal
LVOT:                  Appears normal

Other:  Male gender. Heels visualized.
Cervix Uterus Adnexa

Cervix
Length:           3.89  cm.
Normal appearance by transabdominal scan.

Uterus
No abnormality visualized.

Left Ovary
Within normal limits.

Right Ovary
Within normal limits.
Impression

Singleton intrauterine pregnancy at 27+4 weeks, sent for
anatomic survey
Review of the anatomy shows no sonographic markers for
aneuploidy or structural anomalies
Cardiac evaluation not optimal, but no significant anomalies
are noted
Amniotic fluid volume is normal with a MVP of 4.7cm
Estimated fetal weight is 910g which is growth in the 24th
percentile. Abdominal circumference is in the 17th percentile
Recommendations

All relevant anatomy is seen. would recomemnd repeat scan
in 4-6 weeks due to low growth percentiles
